# Patient Record
Sex: Male | Born: 2000 | Race: Black or African American | Hispanic: No | Marital: Single | Smoking: Never smoker
Health system: Southern US, Community
[De-identification: ages and names within clinical notes are randomized; demographics above are authoritative.]

## PROBLEM LIST (undated history)

## (undated) DIAGNOSIS — D571 Sickle-cell disease without crisis: Secondary | ICD-10-CM

## (undated) DIAGNOSIS — K5090 Crohn's disease, unspecified, without complications: Secondary | ICD-10-CM

## (undated) DIAGNOSIS — D573 Sickle-cell trait: Secondary | ICD-10-CM

---
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
## 2018-05-02 ENCOUNTER — Encounter: Payer: Self-pay | Admitting: Podiatry

## 2018-05-02 ENCOUNTER — Ambulatory Visit (INDEPENDENT_AMBULATORY_CARE_PROVIDER_SITE_OTHER): Payer: 59 | Admitting: Podiatry

## 2018-05-02 DIAGNOSIS — L600 Ingrowing nail: Secondary | ICD-10-CM | POA: Diagnosis not present

## 2018-05-02 MED ORDER — CEPHALEXIN 500 MG PO CAPS: 500.0000 mg | ORAL_CAPSULE | Freq: Three times a day (TID) | ORAL | 0 refills | Status: DC

## 2018-05-02 MED ORDER — MUPIROCIN 2 % EX OINT: 1.0000 "application " | TOPICAL_OINTMENT | Freq: Two times a day (BID) | CUTANEOUS | 2 refills | Status: DC

## 2018-05-02 NOTE — Patient Instructions (Signed)

## 2018-05-12 NOTE — Progress Notes (Signed)
Subjective: 18 year old male presents the office today for concerns of a recurrent ingrown toenail of the left big toe.  This is been ongoing for last several months the area is painful with pressure in shoes.  Denies any drainage or pus coming from the area. Denies any systemic complaints such as fevers, chills, nausea, vomiting. No acute changes since last appointment, and no other complaints at this time.   He recently just started stelara and had his first dose last week and scheduled for next week for second dose.  Objective: AAO x3, NAD DP/PT pulses palpable bilaterally, CRT less than 3 seconds Chronic ingrown toenails present in the left hallux toenail there is granulation tissue present medial corner on both medial lateral aspects.  There is no drainage or pus there is localized edema, to the area and faint erythema.  No ascending cellulitis.  No malodor. No open lesions or pre-ulcerative lesions.  No pain with calf compression, swelling, warmth, erythema  Assessment: Chronic ingrown toenails left hallux  Plan: -All treatment options discussed with the patient including all alternatives, risks, complications.  -At this time I do recommend a partial nail avulsion with chemical matricectomy given the ongoing nature of this.  However he just recently started his new medication.  Scheduled for next dose next week.  We will go ahead and do a round of antibiotics are prescribed Keflex to do before his next dose.  Recommend Epson salt soaks, antibiotic ointment and a bandage.  We will plan on doing the procedure after he finishes the next dose and that way he has a several weeks office in order to heal without any issues. -Patient encouraged to call the office with any questions, concerns, change in symptoms.

## 2018-05-19 ENCOUNTER — Ambulatory Visit: Payer: 59 | Admitting: Podiatry

## 2018-06-02 ENCOUNTER — Ambulatory Visit: Payer: 59 | Admitting: Podiatry

## 2018-06-10 IMAGING — CT CT ABD-PELV W/ CM
2 of 4 series · 15 of 46 positions shown, 17 images · IV contrast (iopamidol)
Comparison: 06/29/2014 CT abdomen and pelvis.

CLINICAL DATA: 15 y/o  M; Crohn's flare with concern for abscess.

EXAM:
CT ABDOMEN AND PELVIS WITH CONTRAST
TECHNIQUE: Multidetector CT imaging of the abdomen and pelvis was performed
using the standard protocol following bolus administration of
intravenous contrast.
CONTRAST:  100mL [id number] IOPAMIDOL (XGGEQ6-NJJ) INJECTION 61%

[Series 3: a/p w/ 5mm · axial · 0.71mm/px · z∈[+858,+1284]mm · 12 of 95 slices shown, 14 images]
[im 5/95  soft-tissue]
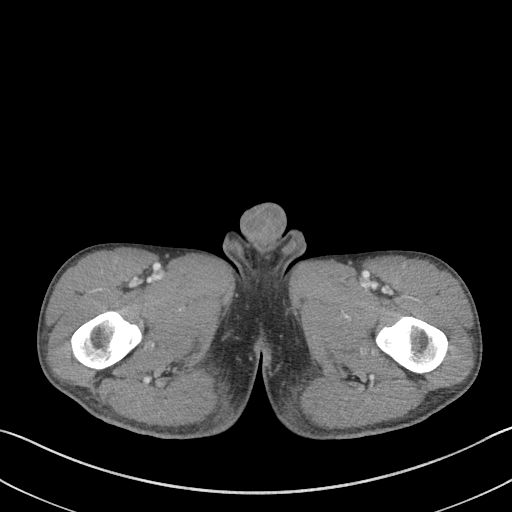
[im 5/95  bone]
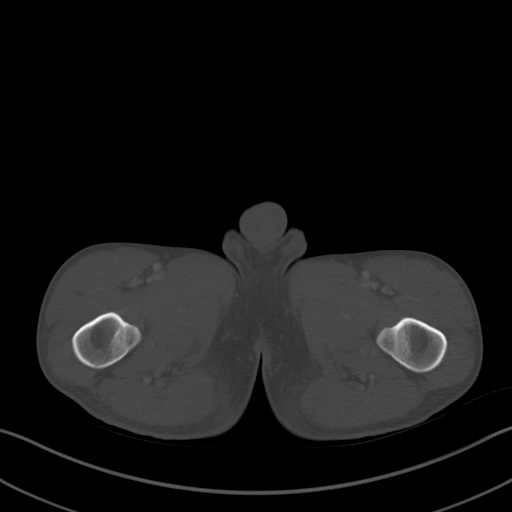
[im 15/95  soft-tissue]
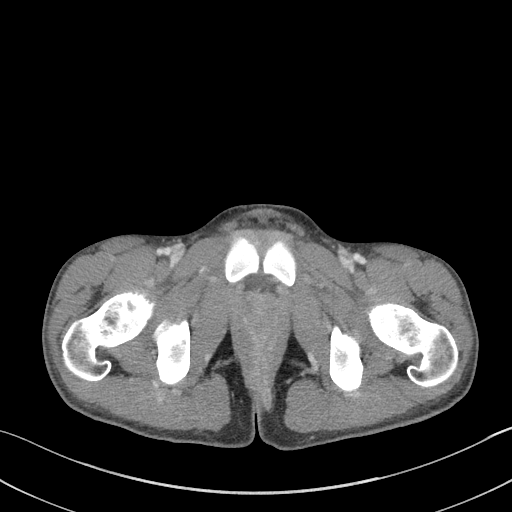
[im 19/95  soft-tissue]
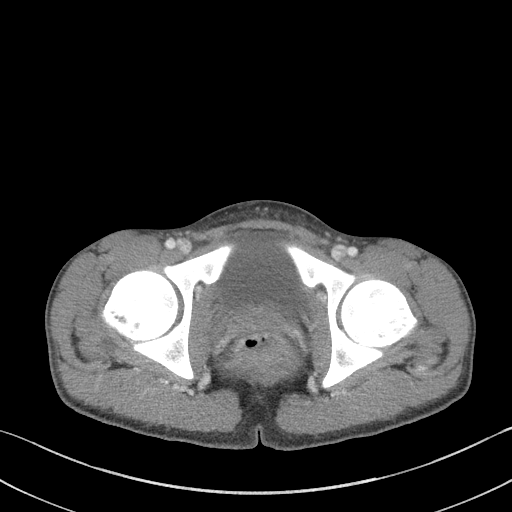
[im 29/95  soft-tissue]
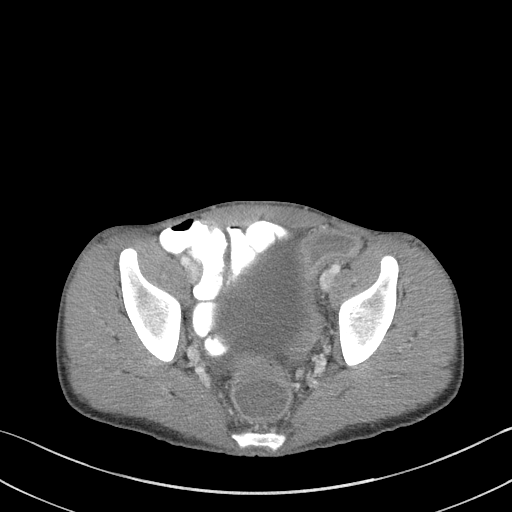
[im 38/95  soft-tissue]
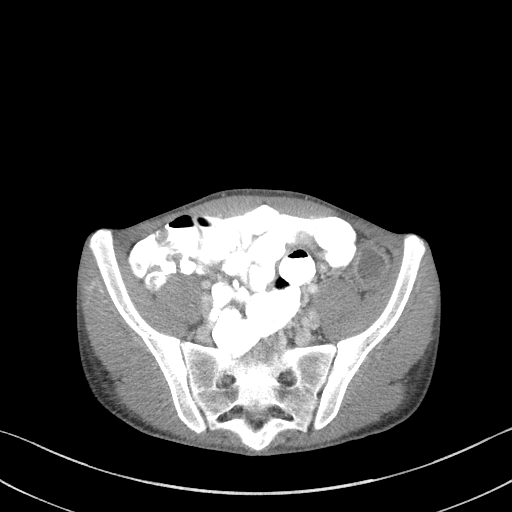
[im 43/95  soft-tissue]
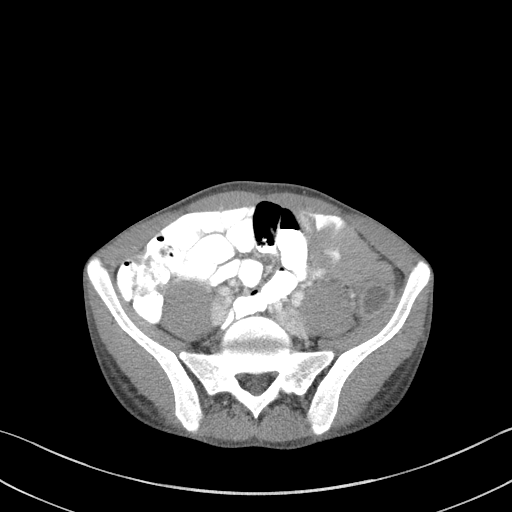
[im 52/95  soft-tissue]
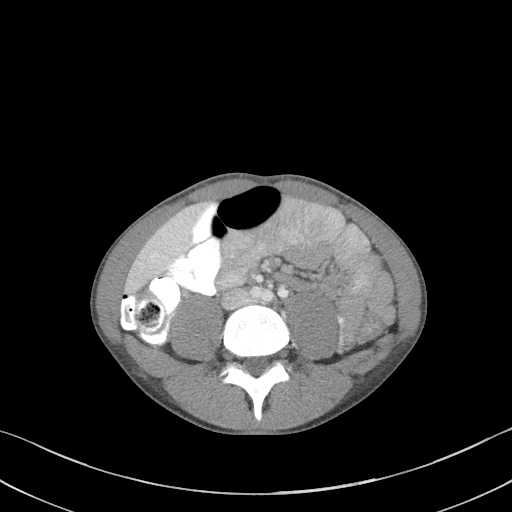
[im 57/95  soft-tissue]
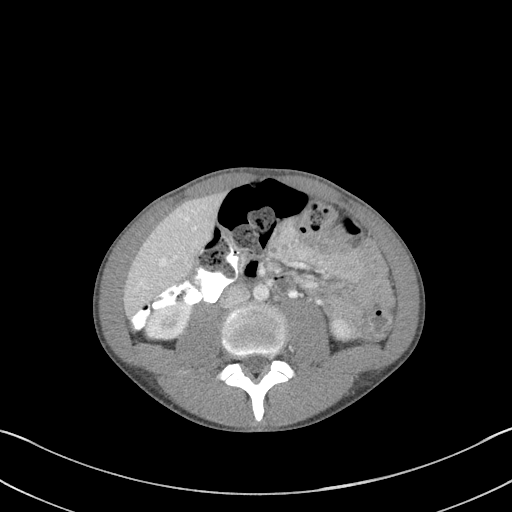
[im 66/95  soft-tissue]
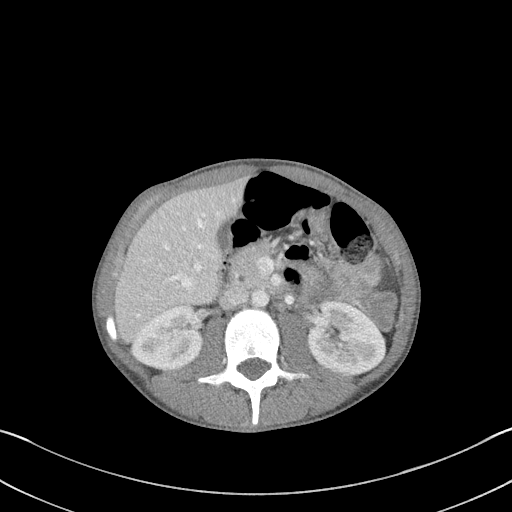
[im 66/95  bone]
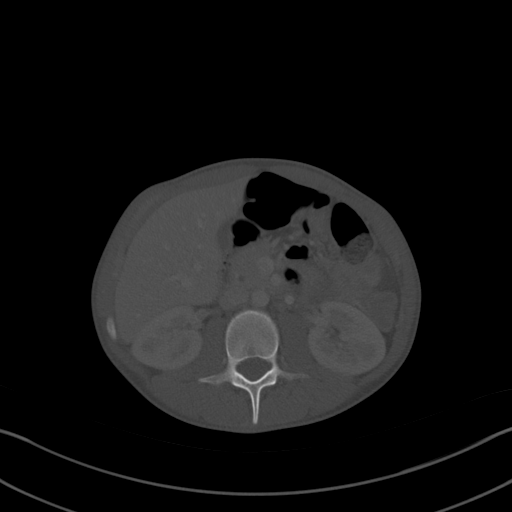
[im 76/95  soft-tissue]
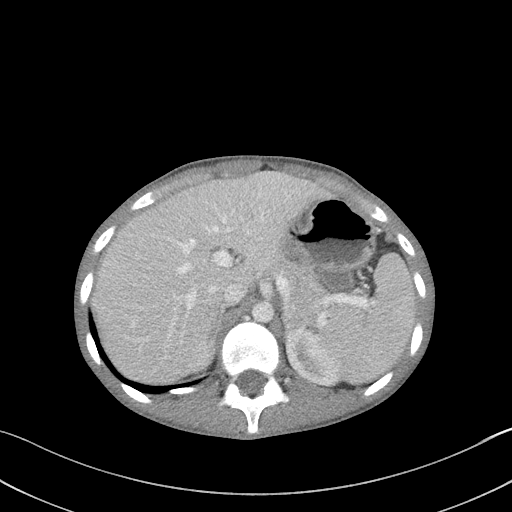
[im 80/95  soft-tissue]
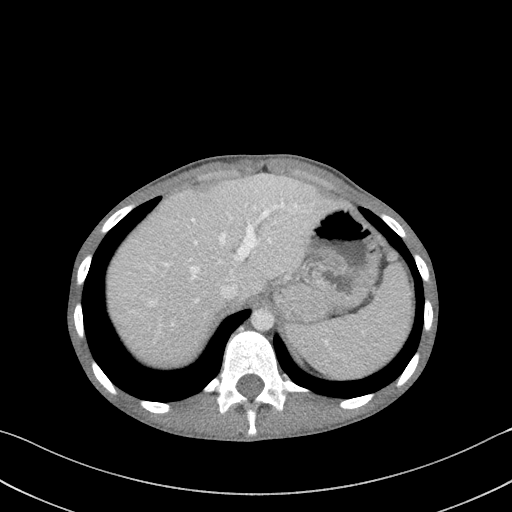
[im 90/95  soft-tissue]
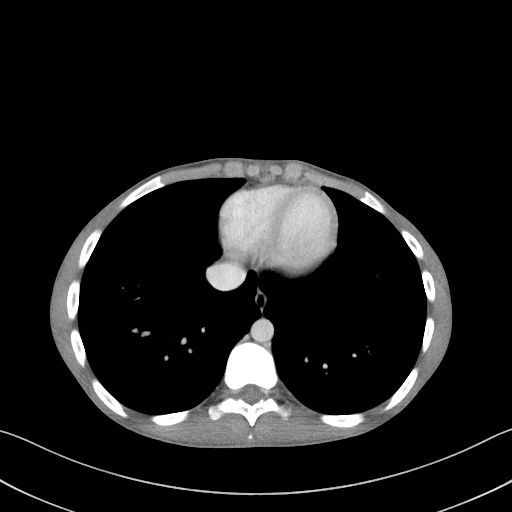

[Series 6: a/p w/ cor · coronal · 0.57mm/px · 3 of 106 slices shown]
[im 36/106  soft-tissue]
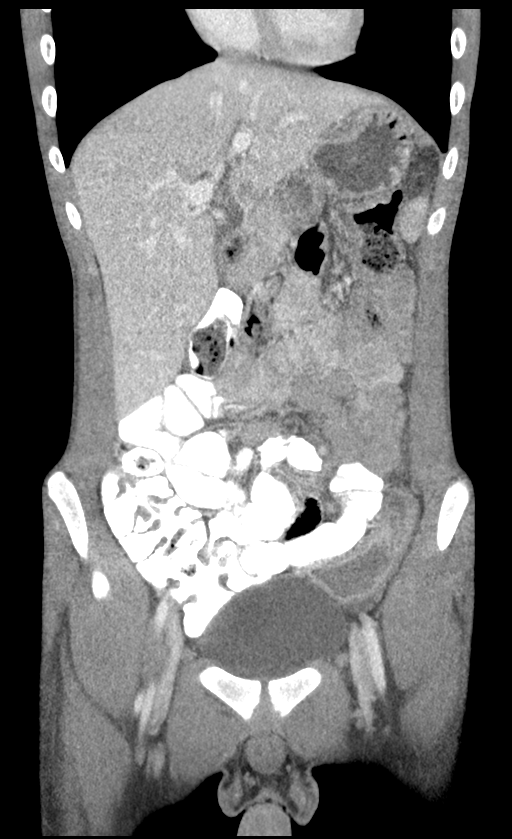
[im 47/106  soft-tissue]
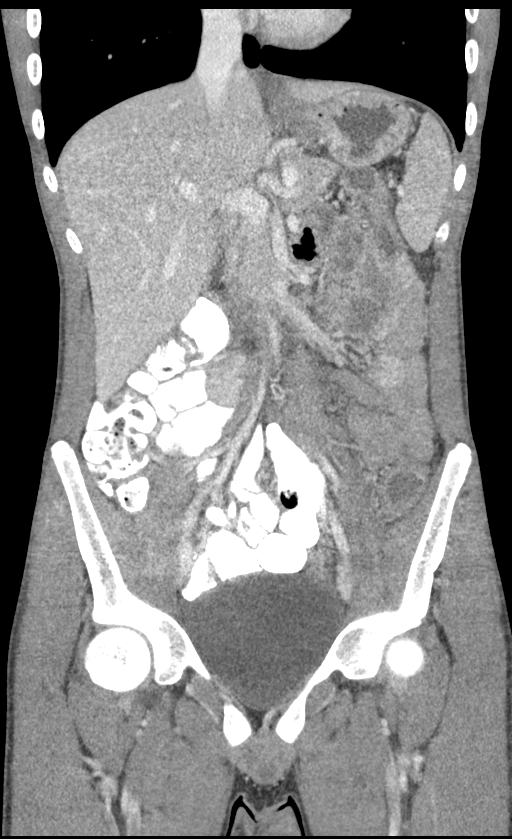
[im 59/106  soft-tissue]
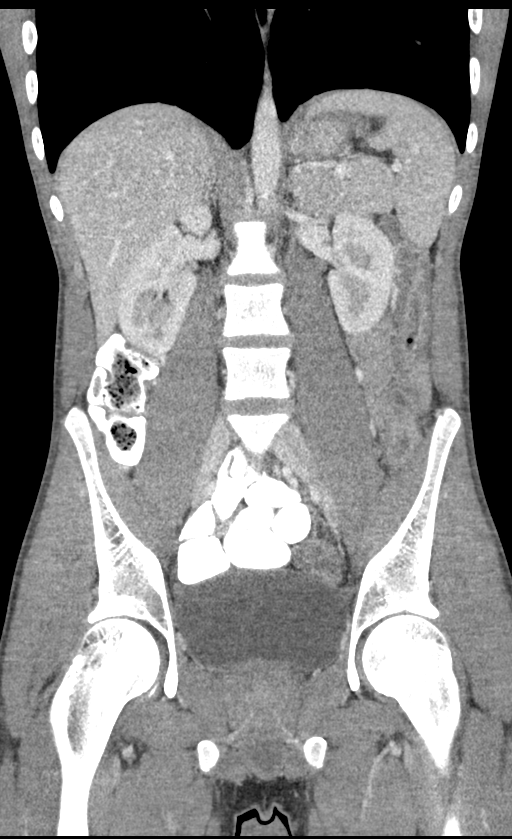

[15 of 46 positions shown; findings below may reference images not displayed]

FINDINGS: Lower chest: No acute abnormality.

Hepatobiliary: No focal liver abnormality is seen. No gallstones,
gallbladder wall thickening, or intrahepatic biliary dilatation.
Stable mild dilatation of the common bile duct.

Pancreas: Unremarkable. No pancreatic ductal dilatation or
surrounding inflammatory changes.

Spleen: Normal in size without focal abnormality.

Adrenals/Urinary Tract: Adrenal glands are unremarkable. Kidneys are
normal, without renal calculi, focal lesion, or hydronephrosis.
Bladder is unremarkable.

Stomach/Bowel: Oral contrast is seen within the distal small bowel
and proximal colon. No obstructive changes of bowel. Normal
appendix. No abscess. Mild mucosal thickening within the descending
and sigmoid colon.

Vascular/Lymphatic: No significant vascular findings are present. No
enlarged abdominal or pelvic lymph nodes.

Reproductive: Prostate is unremarkable.

Other: Developing left perianal fistula extending 2 cm into the left
gluteal fold (series 3, image 81).

Musculoskeletal: No acute or significant osseous findings.
IMPRESSION: 1. Mild mucosal thickening of descending and sigmoid colon may
represent infectious/inflammatory colitis. No obstructive changes of
bowel.
2. No intraperitoneal abscess.
3. Developing left perianal fistula extending approximately 2 cm
into the left gluteal fold.

By: Zeinab Tiger M.D.

## 2018-06-27 ENCOUNTER — Other Ambulatory Visit: Payer: Self-pay

## 2018-06-27 ENCOUNTER — Ambulatory Visit (INDEPENDENT_AMBULATORY_CARE_PROVIDER_SITE_OTHER): Payer: 59 | Admitting: Podiatry

## 2018-06-27 ENCOUNTER — Encounter: Payer: Self-pay | Admitting: Podiatry

## 2018-06-27 DIAGNOSIS — L600 Ingrowing nail: Secondary | ICD-10-CM | POA: Diagnosis not present

## 2018-06-27 MED ORDER — HYDROCODONE-ACETAMINOPHEN 5-325 MG PO TABS: 1.0000 | ORAL_TABLET | Freq: Four times a day (QID) | ORAL | 0 refills | Status: DC | PRN

## 2018-06-27 MED ORDER — CEPHALEXIN 500 MG PO CAPS: 500.0000 mg | ORAL_CAPSULE | Freq: Three times a day (TID) | ORAL | 0 refills | Status: DC

## 2018-06-27 NOTE — Patient Instructions (Signed)

## 2018-06-29 ENCOUNTER — Telehealth: Payer: Self-pay | Admitting: Podiatry

## 2018-06-29 NOTE — Telephone Encounter (Signed)
lvm for patient to call and schedule a virtual visit for 1 week nail check.

## 2018-07-05 DIAGNOSIS — L600 Ingrowing nail: Secondary | ICD-10-CM | POA: Insufficient documentation

## 2018-07-05 NOTE — Progress Notes (Signed)
Subjective: 18 year old male presents the office today with his mom for concerns of ingrown toenails to both of his big toes.  Is been a chronic issue for him today he presents today requesting a partial nail avulsion with chemical matricectomy given the chronic nature of the ingrowing nails.  Painful with pressure in shoes.  Denies any drainage or pus coming from the area. Denies any systemic complaints such as fevers, chills, nausea, vomiting. No acute changes since last appointment, and no other complaints at this time.   Objective: AAO x3, NAD DP/PT pulses palpable bilaterally, CRT less than 3 seconds Chronic ingrowing of bilateral hallux toenails along the medial and lateral aspects with granulation tissue present.  There is localized edema there is no erythema or warmth.  No purulence identified.  Tenderness palpation of the nail corners. No open lesions or pre-ulcerative lesions.  No pain with calf compression, swelling, warmth, erythema  Assessment: Chronic bilateral hallux ingrown toenails  Plan: -All treatment options discussed with the patient including all alternatives, risks, complications.  -At this time, the patient is requesting partial nail removal with chemical matricectomy to the symptomatic portion of the nail. Risks and complications were discussed with the patient for which they understand and written consent was obtained. Under sterile conditions a total of 3 mL of a mixture of 2% lidocaine plain and 0.5% Marcaine plain was infiltrated in a hallux block fashion. Once anesthetized, the skin was prepped in sterile fashion. A tourniquet was then applied. Next the medial and laterals aspect of hallux nail border was then sharply excised making sure to remove the entire offending nail border. Once the nails were ensured to be removed area was debrided and the underlying skin was intact. There is no purulence identified in the procedure. Next phenol was then applied under standard  conditions and copiously irrigated. Silvadene was applied. A dry sterile dressing was applied. After application of the dressing the tourniquet was removed and there is found to be an immediate capillary refill time to the digit. The patient tolerated the procedure well any complications. Post procedure instructions were discussed the patient for which he verbally understood. Follow-up in one week for nail check or sooner if any problems are to arise. Discussed signs/symptoms of infection and directed to call the office immediately should any occur or go directly to the emergency room. In the meantime, encouraged to call the office with any questions, concerns, changes symptoms. -Keflex -Patient encouraged to call the office with any questions, concerns, change in symptoms.   Trula Slade DPM

## 2018-07-07 ENCOUNTER — Telehealth (INDEPENDENT_AMBULATORY_CARE_PROVIDER_SITE_OTHER): Payer: 59 | Admitting: Podiatry

## 2018-07-07 DIAGNOSIS — L600 Ingrowing nail: Secondary | ICD-10-CM | POA: Diagnosis not present

## 2018-07-11 NOTE — Progress Notes (Signed)
Virtual Visit via Video Note  I connected with Luke Richmond on 07/11/18 at  4:50 PM EDT by a video enabled telemedicine application and verified that I am speaking with the correct person using two identifiers.  Location: Patient: Home Provider: Office   I discussed the limitations of evaluation and management by telemedicine and the availability of in person appointments. The patient expressed understanding and agreed to proceed.  History of Present Illness: 18 year old male patient underwent bilateral partial nail avulsions with chemical matricectomy to both medial lateral corners.  He states he is doing well.  He is continue soaking Epson salt soaks twice a day covered antibiotic ointment and a bandage.  He states that overall is feeling better.  Some occasional discomfort but overall is much improved.  Denies any drainage or pus.  No other concerns today.  Denies any fevers or chills or any other systemic symptoms.   Observations/Objective: Scabs are starting to form of the procedure sites and there is some small meta granulation tissue present.  On present on the nails there is no pain.  There is no drainage or pus like identified today.  Minimal edema.  Assessment and Plan: Follow-up partial nail avulsion bilateral hallux  Plan continue soaking Epson salt soaks twice a day covered with antibiotic ointment and a bandage in a day which may be area open at nighttime.  Monitor for any signs or symptoms of infection, no need for should any occur.  If not completely healed the next 2 weeks let me know or sooner if any issues are to arise.  Follow Up Instructions: See above   I discussed the assessment and treatment plan with the patient. The patient was provided an opportunity to ask questions and all were answered. The patient agreed with the plan and demonstrated an understanding of the instructions.   The patient was advised to call back or seek an in-person evaluation if the symptoms  worsen or if the condition fails to improve as anticipated.  I provided 7 minutes of non-face-to-face time during this encounter.   Trula Slade, DPM

## 2019-02-24 HISTORY — PX: ILEOSTOMY: SHX1783

## 2019-08-07 ENCOUNTER — Ambulatory Visit (INDEPENDENT_AMBULATORY_CARE_PROVIDER_SITE_OTHER): Payer: No Typology Code available for payment source | Admitting: Podiatry

## 2019-08-07 DIAGNOSIS — M79675 Pain in left toe(s): Secondary | ICD-10-CM | POA: Diagnosis not present

## 2019-08-07 DIAGNOSIS — L600 Ingrowing nail: Secondary | ICD-10-CM | POA: Diagnosis not present

## 2019-08-07 MED ORDER — CEPHALEXIN 500 MG PO CAPS: 500.0000 mg | ORAL_CAPSULE | Freq: Three times a day (TID) | ORAL | 0 refills | Status: DC

## 2019-08-07 NOTE — Patient Instructions (Signed)

## 2019-08-08 NOTE — Progress Notes (Signed)
Subjective: 19 year old male presents With mom for concerns of recurrent ingrown toenail left big toe, medial aspect has been ongoing for last several months has been intermittent and at maximum is pain levels 5/10 describes throbbing discomfort.  Denies any drainage or pus or any swelling. Denies any systemic complaints such as fevers, chills, nausea, vomiting. No acute changes since last appointment, and no other complaints at this time.   Objective: AAO x3, NAD DP/PT pulses palpable bilaterally, CRT less than 3 seconds Incurvation present to the medial aspect left hallux toenail in the proximal aspect with mild tenderness there is no edema, erythema or signs of infection noted today. No pain with calf compression, swelling, warmth, erythema  Assessment: Ingrown toenail left medial hallux reoccurrence  Plan: -All treatment options discussed with the patient including all alternatives, risks, complications.  -At this time, the patient is requesting partial nail removal with chemical matricectomy to the symptomatic portion of the nail. Risks and complications were discussed with the patient for which they understand and written consent was obtained. Under sterile conditions a total of 3 mL of a mixture of 2% lidocaine plain and 0.5% Marcaine plain was infiltrated in a hallux block fashion. Once anesthetized, the skin was prepped in sterile fashion. A tourniquet was then applied. Next the medial aspect of hallux nail border was then sharply excised making sure to remove the entire offending nail border. Once the nails were ensured to be removed area was debrided and the underlying skin was intact. There is no purulence identified in the procedure. Next phenol was then applied under standard conditions and copiously irrigated. Silvadene was applied. A dry sterile dressing was applied. After application of the dressing the tourniquet was removed and there is found to be an immediate capillary refill time  to the digit. The patient tolerated the procedure well any complications. Post procedure instructions were discussed the patient for which he verbally understood. Follow-up in one week for nail check or sooner if any problems are to arise. Discussed signs/symptoms of infection and directed to call the office immediately should any occur or go directly to the emergency room. In the meantime, encouraged to call the office with any questions, concerns, changes symptoms. -Keflex -Patient encouraged to call the office with any questions, concerns, change in symptoms.   Trula Slade DPM

## 2019-08-24 ENCOUNTER — Telehealth (INDEPENDENT_AMBULATORY_CARE_PROVIDER_SITE_OTHER): Payer: No Typology Code available for payment source | Admitting: Podiatry

## 2019-08-24 ENCOUNTER — Other Ambulatory Visit: Payer: Self-pay

## 2019-08-24 DIAGNOSIS — L600 Ingrowing nail: Secondary | ICD-10-CM

## 2019-09-03 NOTE — Progress Notes (Signed)
Virtual Visit via Telephone Note  I connected with Kippy Gohman on 08/24/2019 at  4:30 PM EDT by telephone and verified that I am speaking with the correct person using two identifiers.  Location: Patient: Home Provider: Office    I discussed the limitations, risks, security and privacy concerns of performing an evaluation and management service by telephone and the availability of in person appointments. I also discussed with the patient that there may be a patient responsible charge related to this service. The patient expressed understanding and agreed to proceed.   History of Present Illness:  19 year old male recently underwent left medial hallux partial nail avulsion.  He states the toe is been doing well with no pain, drainage.  He has been soaking and Epson salts, antibiotic ointment and a bandage.  Denies any redness or drainage and swelling.  He has no concerns today.  He states the toe is healing well.   Observations/Objective: Denies any redness or drainage coming from the area no pain.  Assessment and Plan: Status post partial nail avulsion with chemical matricectomy  -Continue soaking Epson salt soaks twice a day covering with antibiotic ointment and a bandage on the day.  Leave the area open at nighttime.  Continues into the area has scabbed and there is no swelling redness drainage or any issues.  Follow me as needed.  Follow Up Instructions: Follow-up as needed   I discussed the assessment and treatment plan with the patient. The patient was provided an opportunity to ask questions and all were answered. The patient agreed with the plan and demonstrated an understanding of the instructions.   The patient was advised to call back or seek an in-person evaluation if the symptoms worsen or if the condition fails to improve as anticipated.  I provided 4 minutes of non-face-to-face time during this encounter.   Trula Slade, DPM

## 2020-06-14 ENCOUNTER — Encounter (HOSPITAL_COMMUNITY): Payer: Self-pay

## 2020-06-14 ENCOUNTER — Ambulatory Visit (HOSPITAL_COMMUNITY): Admission: EM | Admit: 2020-06-14 | Discharge: 2020-06-14 | Payer: No Typology Code available for payment source

## 2020-06-14 ENCOUNTER — Other Ambulatory Visit: Payer: Self-pay

## 2020-06-14 DIAGNOSIS — R1012 Left upper quadrant pain: Secondary | ICD-10-CM | POA: Diagnosis not present

## 2020-06-14 DIAGNOSIS — Z932 Ileostomy status: Secondary | ICD-10-CM

## 2020-06-14 DIAGNOSIS — J111 Influenza due to unidentified influenza virus with other respiratory manifestations: Secondary | ICD-10-CM | POA: Diagnosis not present

## 2020-06-14 DIAGNOSIS — R1011 Right upper quadrant pain: Secondary | ICD-10-CM | POA: Diagnosis not present

## 2020-06-14 DIAGNOSIS — K50919 Crohn's disease, unspecified, with unspecified complications: Secondary | ICD-10-CM

## 2020-06-14 NOTE — ED Notes (Signed)
Patient is being discharged from the Urgent Care and sent to the Emergency Department via [facility] with Mother . Per Olene Craven, PA, patient is in need of higher level of care due to severe chest and abdominal pain. Patient is aware and verbalizes understanding of plan of care.  Vitals:   06/14/20 1506  BP: 116/68  Pulse: (!) 132  Resp: 17  Temp: 99.3 F (37.4 C)  SpO2: 100%

## 2020-06-14 NOTE — ED Provider Notes (Signed)
[facility]    CSN: [id number] Arrival date & time: 06/14/20  1411      History   Chief Complaint Chief Complaint  Patient presents with  . Generalized Body Aches  . Fever  . Emesis  . Diarrhea    HPI Luke Richmond is a 20 y.o. male presenting with flulike symptoms as well as 10/10 abdominal pain and 8/10 chest pain.  Describes chest pain as left-sided and sharp, associated with fast heart rate.  Generalized weakness and body aches.  Endorses nausea with vomiting and increased output into his ileostomy bag.  Tylenol and tramadol providing no relief. Denies shortness of breath, cough, congestion, facial pain, teeth pain, headaches, sore throat, loss of taste/smell, swollen lymph nodes, ear pain.    HPI  Past Medical History:  Diagnosis Date  . Crohn disease ([place])    Symptoms 10/2013, diagnosed 02/2014, last remicade 574m 05/31/2015  . Sickle cell trait ([facility]     Patient Active Problem List   Diagnosis Date Noted  . Ingrown toenail 07/05/2018  . 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 Rectal pain 04/02/2017  . Perirectal abscess 02/02/2017  . Sickle cell trait ([facility] 10/01/2016  . Iron deficiency anemia   . Moderate malnutrition ([facility]   . Crohn's disease with complication ([facility]   . Diarrhea 01/12/2014  . Hematochezia 01/12/2014    History reviewed. No pertinent surgical history.     Home Medications    Prior to Admission medications   Medication Sig Start Date End Date Taking? Authorizing Provider  loperamide (IMODIUM) 2 MG capsule Take by mouth. 04/09/20  Yes [provider]  Risankizumab-rzaa 150 MG/ML SOAJ Inject the contents of 4 pens (600 mg) into the skin at weeks 0, 4, and 8. Then, inject the contents of 2 pens (300 mg) into the skin every 8 weeks thereafter. 04/16/20  Yes [provider]  traMADol (ULTRAM) 50 MG tablet Take 1 tablet by mouth every 6 (six) hours as needed. 04/25/20  Yes [provider]  cephALEXin (KEFLEX) 500 MG capsule Take 1 capsule (500 mg total) by mouth 3 (three) times daily. 08/07/19   WTrula Slade DPM  docusate sodium (COLACE) 100 MG capsule Take by mouth.    [provider]  ferrous sulfate 325 (65 FE) MG tablet Take 325 mg by mouth daily with breakfast.    [provider]  folic acid (FOLVITE) 1 MG tablet TAKE 1 TABLET BY MOUTH EVERY DAY 02/13/17   [provider]  lidocaine (XYLOCAINE) 5 % ointment Apply topically. 04/02/17   [provider]  mesalamine (APRISO) 0.375 g 24 hr capsule Take 1,500 mg by mouth daily.    [provider]  methotrexate (RHEUMATREX) 2.5 MG tablet Take by mouth.    [provider]  Multiple Vitamin (MULTIVITAMIN) capsule Take by mouth.    [provider]  Multiple Vitamins-Minerals (MULTIVITAMIN ADULT PO) Take by mouth.    [provider]  mupirocin ointment (BACTROBAN) 2 % Apply 1 application topically 2 (two) times daily. 05/02/18   WTrula Slade DPM  ondansetron (ZOFRAN-ODT) 4 MG disintegrating tablet DISSOLVE 1 TABLET ON THE TONGUE EVERY 6 HOURS AS NEEDED FOR NAUSEA 12/25/17   [provider]  traMADol (ULTRAM) 50 MG tablet TAKE 1 TABLET BY MOUTH 3 TIMES A DAY AS NEEDED FOR PAIN 04/13/17   [provider]  ustekinumab (STELARA) 90 MG/ML SOSY injection Inject into the skin. 04/25/18   [provider]    Family History Family History  Problem Relation Age of Onset  . Glaucoma Mother   . Hypertension Father   . Diabetes Father        type II  . Diverticulitis Maternal Aunt   . Cancer Maternal Grandmother        colon cancer, maternal great grandmother  . Cancer Other   . Irritable bowel syndrome Other   . Diverticulitis Other     Social History Social History   Tobacco Use  . Smoking status: Never Smoker  . Smokeless tobacco: Never Used  Substance Use Topics  . Alcohol use: No  . Drug use: No     Allergies    Adalimumab, Infliximab, Peanut oil, and Peanut-containing drug products   Review of Systems Review of Systems  Constitutional: Negative for appetite change, chills and fever.  HENT: Negative for congestion, ear pain, rhinorrhea, sinus pressure, sinus pain and sore throat.   Eyes: Negative for redness and visual disturbance.  Respiratory: Negative for cough, chest tightness, shortness of breath and wheezing.   Cardiovascular: Positive for chest pain and palpitations.  Gastrointestinal: Positive for abdominal pain, diarrhea, nausea and vomiting. Negative for constipation.  Genitourinary: Negative for dysuria, frequency and urgency.  Musculoskeletal: Negative for myalgias.  Neurological: Negative for dizziness, weakness and headaches.  Psychiatric/Behavioral: Negative for confusion.  All other systems reviewed and are negative.    Physical Exam Triage Vital Signs ED Triage Vitals  Enc Vitals Group     BP 06/14/20 1506 116/68     Pulse Rate 06/14/20 1506 (!) 132     Resp 06/14/20 1506 17     Temp 06/14/20 1506 99.3 F (37.4 C)     Temp src --      SpO2 06/14/20 1506 100 %     Weight --      Height --      Head Circumference --      Peak Flow --      Pain Score 06/14/20 1504 10     Pain Loc --      Pain Edu? --      Excl. in [place]? --    No data found.  Updated Vital Signs BP 116/68   Pulse (!) 132   Temp 99.3 F (37.4 C)   Resp 17   SpO2 100%   Visual Acuity Right Eye Distance:   Left Eye Distance:   Bilateral Distance:    Right Eye Near:   Left Eye Near:    Bilateral Near:     Physical Exam Vitals reviewed.  Constitutional:      General: He is not in acute distress.    Appearance: Normal appearance. He is ill-appearing. He is not diaphoretic.     Comments: Patient is being transported in wheelchair due to bodyaches  HENT:     Head: Normocephalic and atraumatic.     Mouth/Throat:     Mouth: Mucous membranes are moist.     Comments: Moist mucous  membranes Eyes:     Extraocular Movements: Extraocular movements intact.     Pupils: Pupils are equal, round, and reactive to light.  Cardiovascular:     Rate and Rhythm: Regular rhythm. Tachycardia present.     Pulses:          Radial pulses are 2+ on the right side and 2+ on the left side.     Heart sounds: Normal heart sounds.  Pulmonary:     Effort: Pulmonary effort  is normal.     Breath sounds: Normal breath sounds. No wheezing, rhonchi or rales.  Abdominal:     General: Bowel sounds are normal. There is no distension.     Palpations: Abdomen is soft. There is no mass.     Tenderness: There is no abdominal tenderness. There is no right CVA tenderness, left CVA tenderness, guarding or rebound.     Comments: Abdomen is significantly tender to palpation, worst in LUQ and RUQ surrounding ileostomy bag. Patient with pain out of proportion to exam  Musculoskeletal:     Right lower leg: No edema.     Left lower leg: No edema.  Skin:    General: Skin is warm.     Capillary Refill: Capillary refill takes less than 2 seconds.     Comments: Good skin turgor  Neurological:     General: No focal deficit present.     Mental Status: He is alert and oriented to person, place, and time.  Psychiatric:        Mood and Affect: Mood normal.        Behavior: Behavior normal.        Thought Content: Thought content normal.        Judgment: Judgment normal.      UC Treatments / Results  Labs (all labs ordered are listed, but only abnormal results are displayed) Labs Reviewed - No data to display  EKG   Radiology No results found.  Procedures Procedures (including critical care time)  Medications Ordered in UC Medications - No data to display  Initial Impression / Assessment and Plan / UC Course  I have reviewed the triage vital signs and the nursing notes.  Pertinent labs & imaging results that were available during my care of the patient were reviewed by me and considered in my  medical decision making (see chart for details).     This patient is a 20 year old male presenting with severe abdominal and chest pain following 4 days of flulike symptoms.  He is afebrile but tachycardic, nontachypneic, oxygenating well on room air.  On exam, he does have significant abdominal pain out of proportion to exam, worst surrounding ostomy bag.  He is also ambulating using wheelchair due to severity of his body aches. EKG today with sinus tachycardia .  Given severe abdominal pain and his complicated GI history including complicated Crohn's with fistula, I am referring this patient to the emergency room for further abdominal work-up including imaging.  He is hemodynamically stable for transfer in personal vehicle driven by mother. The patient left after EKG without being discharged. He did state he was headed to the ED.   Final Clinical Impressions(s) / UC Diagnoses   Final diagnoses:  Influenza-like illness  RUQ pain  LUQ pain  Ileostomy in place [facility])  Crohn's disease with complication, unspecified gastrointestinal tract location [facility])     Discharge Instructions     -Head straight to [facility] ED for further evaluation and management of severe abdominal pain in the setting of chron's disease. You likely require abdominal imaging and possibly additional cardiac workup, which we are unable to perform in the urgent care setting.  -If you develop worsening of symptoms while on the way, like abdominal pain, chest pain- stop and call 911.   ED Prescriptions    None     PDMP not reviewed this encounter.   Hazel Sams, PA-C 06/14/20 1702

## 2020-06-14 NOTE — ED Triage Notes (Signed)
Pt in with c/o body aches, diarrhea, vomiting and fever tmax 101 that has been going on since last Tuesday  Pt has been taking tylenol and tramadol with no relief

## 2020-06-14 NOTE — Discharge Instructions (Addendum)
-  Head straight to [facility] ED for further evaluation and management of severe abdominal pain in the setting of chron's disease. You likely require abdominal imaging and possibly additional cardiac workup, which we are unable to perform in the urgent care setting.  -If you develop worsening of symptoms while on the way, like abdominal pain, chest pain- stop and call 911.

## 2020-06-15 ENCOUNTER — Other Ambulatory Visit: Payer: Self-pay

## 2020-06-15 ENCOUNTER — Emergency Department (HOSPITAL_COMMUNITY): Payer: No Typology Code available for payment source

## 2020-06-15 ENCOUNTER — Encounter (HOSPITAL_COMMUNITY): Payer: Self-pay | Admitting: *Deleted

## 2020-06-15 ENCOUNTER — Inpatient Hospital Stay (HOSPITAL_COMMUNITY)
Admission: EM | Admit: 2020-06-15 | Discharge: 2020-06-20 | DRG: 385 | Disposition: A | Discharge status: Home / Self Care | Payer: No Typology Code available for payment source | Attending: Internal Medicine | Admitting: Internal Medicine

## 2020-06-15 DIAGNOSIS — D573 Sickle-cell trait: Secondary | ICD-10-CM | POA: Diagnosis not present

## 2020-06-15 DIAGNOSIS — Z8249 Family history of ischemic heart disease and other diseases of the circulatory system: Secondary | ICD-10-CM | POA: Diagnosis not present

## 2020-06-15 DIAGNOSIS — Z888 Allergy status to other drugs, medicaments and biological substances status: Secondary | ICD-10-CM

## 2020-06-15 DIAGNOSIS — Z833 Family history of diabetes mellitus: Secondary | ICD-10-CM

## 2020-06-15 DIAGNOSIS — Z91010 Allergy to peanuts: Secondary | ICD-10-CM | POA: Diagnosis not present

## 2020-06-15 DIAGNOSIS — K626 Ulcer of anus and rectum: Secondary | ICD-10-CM | POA: Diagnosis present

## 2020-06-15 DIAGNOSIS — K50113 Crohn's disease of large intestine with fistula: Secondary | ICD-10-CM | POA: Diagnosis present

## 2020-06-15 DIAGNOSIS — R1084 Generalized abdominal pain: Secondary | ICD-10-CM

## 2020-06-15 DIAGNOSIS — R112 Nausea with vomiting, unspecified: Secondary | ICD-10-CM | POA: Diagnosis not present

## 2020-06-15 DIAGNOSIS — Z681 Body mass index (BMI) 19 or less, adult: Secondary | ICD-10-CM | POA: Diagnosis not present

## 2020-06-15 DIAGNOSIS — R509 Fever, unspecified: Secondary | ICD-10-CM

## 2020-06-15 DIAGNOSIS — Z800 Family history of malignant neoplasm of digestive organs: Secondary | ICD-10-CM | POA: Diagnosis not present

## 2020-06-15 DIAGNOSIS — R7401 Elevation of levels of liver transaminase levels: Secondary | ICD-10-CM

## 2020-06-15 DIAGNOSIS — Z932 Ileostomy status: Secondary | ICD-10-CM | POA: Diagnosis not present

## 2020-06-15 DIAGNOSIS — R7989 Other specified abnormal findings of blood chemistry: Secondary | ICD-10-CM | POA: Diagnosis present

## 2020-06-15 DIAGNOSIS — Z20822 Contact with and (suspected) exposure to covid-19: Secondary | ICD-10-CM | POA: Diagnosis present

## 2020-06-15 DIAGNOSIS — D571 Sickle-cell disease without crisis: Secondary | ICD-10-CM | POA: Diagnosis present

## 2020-06-15 DIAGNOSIS — Z79899 Other long term (current) drug therapy: Secondary | ICD-10-CM

## 2020-06-15 DIAGNOSIS — Z83511 Family history of glaucoma: Secondary | ICD-10-CM

## 2020-06-15 DIAGNOSIS — E43 Unspecified severe protein-calorie malnutrition: Secondary | ICD-10-CM | POA: Diagnosis not present

## 2020-06-15 HISTORY — DX: Sickle-cell disease without crisis: D57.1

## 2020-06-15 LAB — HEPATITIS PANEL, ACUTE
HCV Ab: NONREACTIVE
Hep A IgM: NONREACTIVE
Hep B C IgM: NONREACTIVE
Hepatitis B Surface Ag: NONREACTIVE

## 2020-06-15 LAB — CBC WITH DIFFERENTIAL/PLATELET
Abs Immature Granulocytes: 0.03 10*3/uL (ref 0.00–0.07)
Basophils Absolute: 0 10*3/uL (ref 0.0–0.1)
Basophils Relative: 1 %
Eosinophils Absolute: 0 10*3/uL (ref 0.0–0.5)
Eosinophils Relative: 0 %
HCT: 48.7 % (ref 39.0–52.0)
Hemoglobin: 15.5 g/dL (ref 13.0–17.0)
Immature Granulocytes: 1 %
Lymphocytes Relative: 18 %
Lymphs Abs: 0.4 10*3/uL — ABNORMAL LOW (ref 0.7–4.0)
MCH: 19.7 pg — ABNORMAL LOW (ref 26.0–34.0)
MCHC: 31.8 g/dL (ref 30.0–36.0)
MCV: 62 fL — ABNORMAL LOW (ref 80.0–100.0)
Monocytes Absolute: 0.2 10*3/uL (ref 0.1–1.0)
Monocytes Relative: 6 %
Neutro Abs: 1.7 10*3/uL (ref 1.7–7.7)
Neutrophils Relative %: 74 %
Platelets: 220 10*3/uL (ref 150–400)
RBC: 7.86 MIL/uL — ABNORMAL HIGH (ref 4.22–5.81)
RDW: 19.7 % — ABNORMAL HIGH (ref 11.5–15.5)
WBC: 2.3 10*3/uL — ABNORMAL LOW (ref 4.0–10.5)
nRBC: 0 % (ref 0.0–0.2)

## 2020-06-15 LAB — COMPREHENSIVE METABOLIC PANEL
ALT: 169 U/L — ABNORMAL HIGH (ref 0–44)
AST: 241 U/L — ABNORMAL HIGH (ref 15–41)
Albumin: 3.5 g/dL (ref 3.5–5.0)
Alkaline Phosphatase: 193 U/L — ABNORMAL HIGH (ref 38–126)
Anion gap: 11 (ref 5–15)
BUN: 12 mg/dL (ref 6–20)
CO2: 24 mmol/L (ref 22–32)
Calcium: 8.8 mg/dL — ABNORMAL LOW (ref 8.9–10.3)
Chloride: 91 mmol/L — ABNORMAL LOW (ref 98–111)
Creatinine, Ser: 0.87 mg/dL (ref 0.61–1.24)
GFR, Estimated: >60 mL/min (ref >60)
Glucose, Bld: 116 mg/dL — ABNORMAL HIGH (ref 70–99)
Potassium: 4 mmol/L (ref 3.5–5.1)
Sodium: 126 mmol/L — ABNORMAL LOW (ref 135–145)
Total Bilirubin: 6.5 mg/dL — ABNORMAL HIGH (ref 0.3–1.2)
Total Protein: 8.3 g/dL — ABNORMAL HIGH (ref 6.5–8.1)

## 2020-06-15 LAB — URINALYSIS, ROUTINE W REFLEX MICROSCOPIC
Bacteria, UA: NONE SEEN
Bilirubin Urine: NEGATIVE
Glucose, UA: NEGATIVE mg/dL
Ketones, ur: NEGATIVE mg/dL
Leukocytes,Ua: NEGATIVE
Nitrite: NEGATIVE
Protein, ur: NEGATIVE mg/dL
Specific Gravity, Urine: >1.046 — ABNORMAL HIGH (ref 1.005–1.030)
pH: 5 (ref 5.0–8.0)

## 2020-06-15 LAB — RESP PANEL BY RT-PCR (FLU A&B, COVID) ARPGX2
Influenza A by PCR: NEGATIVE
Influenza B by PCR: NEGATIVE
SARS Coronavirus 2 by RT PCR: NEGATIVE

## 2020-06-15 LAB — HIV ANTIBODY (ROUTINE TESTING W REFLEX): HIV Screen 4th Generation wRfx: NONREACTIVE

## 2020-06-15 LAB — LIPASE, BLOOD: Lipase: 21 U/L (ref 11–51)

## 2020-06-15 LAB — ACETAMINOPHEN LEVEL: Acetaminophen (Tylenol), Serum: 12 ug/mL (ref 10–30)

## 2020-06-15 MED ORDER — METRONIDAZOLE IN NACL 5-0.79 MG/ML-% IV SOLN
500.0000 mg | Freq: Three times a day (TID) | INTRAVENOUS | Status: DC
  Administered 2020-06-15 – 2020-06-20 (×14): 500 mg via INTRAVENOUS
  Filled 2020-06-15 (×17): qty 100

## 2020-06-15 MED ORDER — HYDROMORPHONE HCL 1 MG/ML IJ SOLN
0.5000 mg | Freq: Four times a day (QID) | INTRAMUSCULAR | Status: DC | PRN
  Administered 2020-06-15 – 2020-06-18 (×4): 0.5 mg via INTRAVENOUS
  Filled 2020-06-15 (×4): qty 0.5

## 2020-06-15 MED ORDER — DOCUSATE SODIUM 100 MG PO CAPS: 100.0000 mg | ORAL_CAPSULE | Freq: Every day | ORAL | Status: DC | PRN

## 2020-06-15 MED ORDER — DICYCLOMINE HCL 10 MG PO CAPS
10.0000 mg | ORAL_CAPSULE | Freq: Three times a day (TID) | ORAL | Status: DC | PRN
  Administered 2020-06-16: 10 mg via ORAL
  Filled 2020-06-15 (×2): qty 1

## 2020-06-15 MED ORDER — TRAMADOL HCL 50 MG PO TABS
50.0000 mg | ORAL_TABLET | Freq: Two times a day (BID) | ORAL | Status: DC | PRN
  Administered 2020-06-17: 50 mg via ORAL
  Filled 2020-06-15: qty 1

## 2020-06-15 MED ORDER — ENSURE ENLIVE PO LIQD
237.0000 mL | Freq: Two times a day (BID) | ORAL | Status: DC
  Administered 2020-06-16: 237 mL via ORAL

## 2020-06-15 MED ORDER — FERROUS SULFATE 325 (65 FE) MG PO TABS
325.0000 mg | ORAL_TABLET | Freq: Every day | ORAL | Status: DC
  Administered 2020-06-16 – 2020-06-19 (×3): 325 mg via ORAL
  Filled 2020-06-15 (×3): qty 1

## 2020-06-15 MED ORDER — ACETAMINOPHEN 500 MG PO TABS
1000.0000 mg | ORAL_TABLET | Freq: Four times a day (QID) | ORAL | Status: DC | PRN
  Administered 2020-06-15 – 2020-06-16 (×2): 1000 mg via ORAL
  Filled 2020-06-15 (×2): qty 2

## 2020-06-15 MED ORDER — SODIUM CHLORIDE 0.9 % IV BOLUS
1000.0000 mL | Freq: Once | INTRAVENOUS | Status: AC
  Administered 2020-06-15: 1000 mL via INTRAVENOUS

## 2020-06-15 MED ORDER — SODIUM CHLORIDE 0.9 % IV SOLN
2.0000 g | INTRAVENOUS | Status: DC
  Administered 2020-06-15 – 2020-06-19 (×5): 2 g via INTRAVENOUS
  Filled 2020-06-15: qty 20
  Filled 2020-06-15 (×2): qty 2
  Filled 2020-06-15: qty 20
  Filled 2020-06-15: qty 2

## 2020-06-15 MED ORDER — ONDANSETRON 4 MG PO TBDP: 4.0000 mg | ORAL_TABLET | Freq: Three times a day (TID) | ORAL | Status: DC | PRN

## 2020-06-15 MED ORDER — MORPHINE SULFATE (PF) 4 MG/ML IV SOLN
4.0000 mg | Freq: Once | INTRAVENOUS | Status: AC
  Administered 2020-06-15: 4 mg via INTRAVENOUS
  Filled 2020-06-15: qty 1

## 2020-06-15 MED ORDER — IOHEXOL 300 MG/ML  SOLN
100.0000 mL | Freq: Once | INTRAMUSCULAR | Status: AC | PRN
  Administered 2020-06-15: 100 mL via INTRAVENOUS

## 2020-06-15 MED ORDER — ONDANSETRON HCL 4 MG PO TABS
4.0000 mg | ORAL_TABLET | Freq: Four times a day (QID) | ORAL | Status: DC | PRN
  Administered 2020-06-16 (×2): 4 mg via ORAL
  Filled 2020-06-15 (×2): qty 1

## 2020-06-15 MED ORDER — CIPROFLOXACIN IN D5W 400 MG/200ML IV SOLN: 400.0000 mg | Freq: Two times a day (BID) | INTRAVENOUS | Status: DC

## 2020-06-15 MED ORDER — ONDANSETRON HCL 4 MG/2ML IJ SOLN
4.0000 mg | Freq: Once | INTRAMUSCULAR | Status: AC
  Administered 2020-06-15: 4 mg via INTRAVENOUS
  Filled 2020-06-15: qty 2

## 2020-06-15 MED ORDER — POTASSIUM CHLORIDE IN NACL 20-0.9 MEQ/L-% IV SOLN
INTRAVENOUS | Status: DC
  Filled 2020-06-15 (×11): qty 1000

## 2020-06-15 MED ORDER — ONDANSETRON HCL 4 MG/2ML IJ SOLN
4.0000 mg | Freq: Four times a day (QID) | INTRAMUSCULAR | Status: DC | PRN
  Filled 2020-06-15: qty 2

## 2020-06-15 NOTE — ED Notes (Signed)
Pt said he has not used the bathroom and does not think he'll be able to go anytime soon.

## 2020-06-15 NOTE — H&P (Signed)
History and Physical    Luke Richmond OEU:[id number] DOB: [birth year] DOA: 06/15/2020  PCP: Karleen Dolphin, MD  Patient coming from: Home  Chief Complaint: Fever nausea vomiting diarrhea  HPI: Luke Richmond is a 20 y.o. male with medical history significant of Crohn's disease gets his care at [facility] started on risankizumab approximately 6 weeks ago status post ileostomy history of fistula reports that his imaging since his surgery in the last year have been somewhat normal at [facility] for 5 days has been having fever nausea vomiting diarrhea.  Denies any sick contacts.  He is having some abdominal pain in the left lower quadrant.  He reports he is not frequently on steroids and has only been to the emergency department once in the last year and only hospitalized once in the last year for his surgery.  He has been losing weight however and has been diagnosed with Crohn's since the age of 14.  His GI physician at [facility] was called they recommended transferring there but there is no beds.  GI here was called who is agreeable to see patient in consultation.  Patient be referred for admission for uncertain GI illness.   Review of Systems: As per HPI otherwise 10 point review of systems negative.   Past Medical History:  Diagnosis Date  . Crohn disease ([place])    Symptoms 10/2013, diagnosed 02/2014, last remicade 543m 05/31/2015  . Sickle cell anemia (HCC)   . Sickle cell trait ([place]     History reviewed. No pertinent surgical history.   reports that he has never smoked. He has never used smokeless tobacco. He reports that he does not drink alcohol and does not use drugs.  Allergies  Allergen Reactions  . Adalimumab     Developed antibodies to med  . Infliximab Other (See Comments)    "Developed Antibodies" Developed antibody to med  . Peanut Oil     Throat itches  . Peanut-Containing Drug Products     Family History  Problem Relation Age of Onset  . Glaucoma Mother   .  Hypertension Father   . Diabetes Father        type II  . Diverticulitis Maternal Aunt   . Cancer Maternal Grandmother        colon cancer, maternal great grandmother  . Cancer Other   . Irritable bowel syndrome Other   . Diverticulitis Other     Prior to Admission medications   Medication Sig Start Date End Date Taking? Authorizing Provider  acetaminophen (TYLENOL) 500 MG tablet Take 1,000 mg by mouth every 6 (six) hours as needed for moderate pain.   Yes [provider]  dicyclomine (BENTYL) 10 MG capsule Take 10 mg by mouth 3 (three) times daily as needed for spasms. 09/21/19  Yes [provider]  docusate sodium (COLACE) 100 MG capsule Take 100 mg by mouth daily as needed for mild constipation.   Yes [provider]  ferrous sulfate 325 (65 FE) MG tablet Take 325 mg by mouth daily with breakfast.   Yes [provider]  Multiple Vitamins-Minerals (MULTIVITAMIN ADULT PO) Take 1 tablet by mouth daily.   Yes [provider]  ondansetron (ZOFRAN-ODT) 4 MG disintegrating tablet Take 4 mg by mouth every 8 (eight) hours as needed for nausea. 12/25/17  Yes [provider]  Risankizumab-rzaa 150 MG/ML SOAJ Inject 150 mg into the vein every 28 (twenty-eight) days. 04/16/20  Yes [provider]  traMADol (ULTRAM) 50 MG  tablet Take 50 mg by mouth every 12 (twelve) hours as needed for moderate pain. 04/13/17  Yes [provider]  cephALEXin (KEFLEX) 500 MG capsule Take 1 capsule (500 mg total) by mouth 3 (three) times daily. Patient not taking: No sig reported 08/07/19   Trula Slade, DPM  mupirocin ointment (BACTROBAN) 2 % Apply 1 application topically 2 (two) times daily. Patient not taking: No sig reported 05/02/18   Trula Slade, DPM    Physical Exam: Vitals:   06/15/20 1400 06/15/20 1430 06/15/20 1500 06/15/20 1530  BP: 133/90 121/78 117/79 122/79  Pulse: (!) 107 100 96 95  Resp:  18  18  Temp:      TempSrc:       SpO2: 99% 99% 99% 99%      Constitutional: NAD, calm, comfortable Vitals:   06/15/20 1400 06/15/20 1430 06/15/20 1500 06/15/20 1530  BP: 133/90 121/78 117/79 122/79  Pulse: (!) 107 100 96 95  Resp:  18  18  Temp:      TempSrc:      SpO2: 99% 99% 99% 99%   Eyes: PERRL, lids and conjunctivae normal ENMT: Mucous membranes are moist. Posterior pharynx clear of any exudate or lesions.Normal dentition.  Neck: normal, supple, no masses, no thyromegaly Respiratory: clear to auscultation bilaterally, no wheezing, no crackles. Normal respiratory effort. No accessory muscle use.  Cardiovascular: Regular rate and rhythm, no murmurs / rubs / gallops. No extremity edema. 2+ pedal pulses. No carotid bruits.  Abdomen: no tenderness, no masses palpated. No hepatosplenomegaly. Bowel sounds positive.  Musculoskeletal: no clubbing / cyanosis. No joint deformity upper and lower extremities. Good ROM, no contractures. Normal muscle tone.  Skin: no rashes, lesions, ulcers. No induration Neurologic: CN 2-12 grossly intact. Sensation intact, DTR normal. Strength 5/5 in all 4.  Psychiatric: Normal judgment and insight. Alert and oriented x 3. Normal mood.    Labs on Admission: I have personally reviewed following labs and imaging studies  CBC: Recent Labs  Lab 06/15/20 1247  WBC 2.3*  NEUTROABS 1.7  HGB 15.5  HCT 48.7  MCV 62.0*  PLT 817   Basic Metabolic Panel: Recent Labs  Lab 06/15/20 1247  NA 126*  K 4.0  CL 91*  CO2 24  GLUCOSE 116*  BUN 12  CREATININE 0.87  CALCIUM 8.8*   GFR: CrCl cannot be calculated (Unknown ideal weight.). Liver Function Tests: Recent Labs  Lab 06/15/20 1247  AST 241*  ALT 169*  ALKPHOS 193*  BILITOT 6.5*  PROT 8.3*  ALBUMIN 3.5   Recent Labs  Lab 06/15/20 1247  LIPASE 21   No results for input(s): AMMONIA in the last 168 hours. Coagulation Profile: No results for input(s): INR, PROTIME in the last 168 hours. Cardiac Enzymes: No results  for input(s): CKTOTAL, CKMB, CKMBINDEX, TROPONINI in the last 168 hours. BNP (last 3 results) No results for input(s): PROBNP in the last 8760 hours. HbA1C: No results for input(s): HGBA1C in the last 72 hours. CBG: No results for input(s): GLUCAP in the last 168 hours. Lipid Profile: No results for input(s): CHOL, HDL, LDLCALC, TRIG, CHOLHDL, LDLDIRECT in the last 72 hours. Thyroid Function Tests: No results for input(s): TSH, T4TOTAL, FREET4, T3FREE, THYROIDAB in the last 72 hours. Anemia Panel: No results for input(s): VITAMINB12, FOLATE, FERRITIN, TIBC, IRON, RETICCTPCT in the last 72 hours. Urine analysis: No results found for: COLORURINE, APPEARANCEUR, LABSPEC, PHURINE, GLUCOSEU, HGBUR, BILIRUBINUR, KETONESUR, PROTEINUR, UROBILINOGEN, NITRITE, LEUKOCYTESUR Sepsis Labs: !!!!!!!!!!!!!!!!!!!!!!!!!!!!!!!!!!!!!!!!!!!! @LABRCNTIP (procalcitonin:4,lacticidven:4) ) Recent  Results (from the past 240 hour(s))  Resp Panel by RT-PCR (Flu A&B, Covid) Nasopharyngeal Swab     Status: None   Collection Time: 06/15/20 12:47 PM   Specimen: Nasopharyngeal Swab; Nasopharyngeal(NP) swabs in vial transport medium  Result Value Ref Range Status   SARS Coronavirus 2 by RT PCR NEGATIVE NEGATIVE Final    Comment: (NOTE) SARS-CoV-2 target nucleic acids are NOT DETECTED.  The SARS-CoV-2 RNA is generally detectable in upper respiratory specimens during the acute phase of infection. The lowest concentration of SARS-CoV-2 viral copies this assay can detect is 138 copies/mL. A negative result does not preclude SARS-Cov-2 infection and should not be used as the sole basis for treatment or other patient management decisions. A negative result may occur with  improper specimen collection/handling, submission of specimen other than nasopharyngeal swab, presence of viral mutation(s) within the areas targeted by this assay, and inadequate number of viral copies(<138 copies/mL). A negative result must be combined  with clinical observations, patient history, and epidemiological information. The expected result is Negative.  Fact Sheet for Patients:  EntrepreneurPulse.com.au  Fact Sheet for Healthcare Providers:  IncredibleEmployment.be  This test is no t yet approved or cleared by the [place] FDA and  has been authorized for detection and/or diagnosis of SARS-CoV-2 by FDA under an Emergency Use Authorization (EUA). This EUA will remain  in effect (meaning this test can be used) for the duration of the COVID-19 declaration under Section 564(b)(1) of the Act, 21 U.S.C.section 360bbb-3(b)(1), unless the authorization is terminated  or revoked sooner.       Influenza A by PCR NEGATIVE NEGATIVE Final   Influenza B by PCR NEGATIVE NEGATIVE Final    Comment: (NOTE) The Xpert Xpress SARS-CoV-2/FLU/RSV plus assay is intended as an aid in the diagnosis of influenza from Nasopharyngeal swab specimens and should not be used as a sole basis for treatment. Nasal washings and aspirates are unacceptable for Xpert Xpress SARS-CoV-2/FLU/RSV testing.  Fact Sheet for Patients: EntrepreneurPulse.com.au  Fact Sheet for Healthcare Providers: IncredibleEmployment.be  This test is not yet approved or cleared by the [place] FDA and has been authorized for detection and/or diagnosis of SARS-CoV-2 by FDA under an Emergency Use Authorization (EUA). This EUA will remain in effect (meaning this test can be used) for the duration of the COVID-19 declaration under Section 564(b)(1) of the Act, 21 U.S.C. section 360bbb-3(b)(1), unless the authorization is terminated or revoked.  Performed at [facility], [place] [address] [place], [place] [address]      Radiological Exams on Admission: CT ABDOMEN PELVIS W CONTRAST  Result Date: 06/15/2020 CLINICAL DATA:  Abdominal pain. Fever. Nausea and vomiting. Diarrhea.  Crohn disease. Sickle cell disease. EXAM: CT ABDOMEN AND PELVIS WITH CONTRAST TECHNIQUE: Multidetector CT imaging of the abdomen and pelvis was performed using the standard protocol following bolus administration of intravenous contrast. CONTRAST:  147m OMNIPAQUE IOHEXOL 300 MG/ML  SOLN COMPARISON:  07/21/2016 FINDINGS: Lower Chest: No acute findings. Hepatobiliary: Tiny sub-cm low-attenuation lesion is seen near the junction the right and left lobes which is too small to characterize but most likely represents a tiny cyst. No other liver lesions identified. Gallbladder is unremarkable. No evidence of biliary ductal dilatation. Pancreas:  No mass or inflammatory changes. Spleen: Within normal limits in size and appearance. Adrenals/Urinary Tract: No masses identified. No evidence of ureteral calculi or hydronephrosis. Stomach/Bowel: Small bowel is unremarkable in appearance. Mild wall thickening, mucosal enhancement, and loss of normal haustral fold pattern is seen  involving the transverse, descending, and rectosigmoid colon, which is similar to prior exam, and consistent with mild chronic colitis. No evidence of abscess. A linear density is seen in the left perianal region extending along the left gluteal cleft. This is incompletely visualized on this exam but is consistent with a perianal fistula. Vascular/Lymphatic: No pathologically enlarged lymph nodes. No acute vascular findings. Reproductive:  No mass or other significant abnormality. Other:  None. Musculoskeletal:  No suspicious bone lesions identified. IMPRESSION: Mild chronic colitis involving the transverse, descending, and rectosigmoid colon, similar in appearance to prior study. No evidence of abscess or bowel obstruction. Left perianal fistula noted, but incompletely visualized on this exam. Nonemergent pelvic MRI without and with contrast could be performed for further evaluation. Electronically Signed   By: Marlaine Hind M.D.   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  Could be infectious.  GI panel pending.  We will hold off on steroids at this time and placed on Cipro and Flagyl.  GI consultation obtained and pending at this time.  We will hold off on steroids until GI evaluated.  Abdominal exam benign.  Active Problems:    Sickle cell trait (HCC)-noted    Elevated LFTs-unclear if this is associated with his recent medication started for his Crohn's.  We will continue to serial monitor.  GI consulted.    Further recommendations pending on overall hospital course   DVT prophylaxis: Ambulate Code Status: Full Family Communication: Mother Disposition Plan: 1 to 3 days Consults called: Gastroenterology Dr. Alessandra Bevels Admission status: Admit   Amit Leece A MD [facility]  If 7PM-7AM, please contact night-coverage [email or web address] Password [email or web address]  06/15/2020, 4:01 PM

## 2020-06-15 NOTE — ED Provider Notes (Signed)
  Physical Exam  BP 121/78   Pulse 100   Temp 98.4 F (36.9 C) (Oral)   Resp 18   SpO2 99%   Physical Exam  ED Course/Procedures     Procedures  MDM  Patient care assumed from Eloise Harman. PA-C at shift change, please see her note for a full HPI. Briefly, patient here with prior history of Crohn's disease on [place], sickle cell trait under the care of Dr. Renee Harder at [facility].  Patient presents here with 5 days worth of abdominal pain and URI symptoms.  Has been running a subjective fever, afebrile on arrival.  CMP with multiple electrolyte derangements.  LFTs remarkable for AST 241 and ALT 169, with a total bili of 6.5.  Normal kidney function.  Negative COVID and flu test.  CT abdomen was within normal limits.  Prior colleague consulted Dr. Newman Pies of GI over at [place] patient admitted to the hospital for fluid resuscitation and trending of his LFT.  Adding hepatitis panel along with a second liter of fluids along with a stool PCR.  He is hemodynamically stable, week for his GI is available if needed.  Will place call for hospitalist admission at this time.  3:40 PM Spoke to Dr. Shanon Brow who will like patient run by gastroenterology at [facility] long prior to admitting patient.  He remains hemodynamically stable.  3:49 PM spoke to gastroenterology Dr.Brahmbhatt, who is agreeable of consultation if needed.  We will repeat hospitalist Dr. Shanon Brow for hospital admission.   Portions of this note were generated with [employer]. Dictation errors may occur despite best attempts at proofreading.     Janeece Fitting, PA-C 06/15/20 1552    Arnaldo Natal, MD 06/15/20 [id number]

## 2020-06-15 NOTE — Progress Notes (Signed)
Pt arrived to room 1503 from ED.

## 2020-06-15 NOTE — ED Notes (Signed)
Mother at bedside.

## 2020-06-15 NOTE — ED Triage Notes (Signed)
Pt complains of body aches, n/v/d for the past 4-5 days. He has hx of Crohn's disease, has ileostomy. He receives weekly lactated ringers infusions. He has been taking tylenol and keeping down fluids.

## 2020-06-15 NOTE — ED Provider Notes (Addendum)
[place] DEPT Provider Note   CSN: [id number] Arrival date & time: 06/15/20  1141     History Chief Complaint  Patient presents with  . Generalized Body Aches  . Nausea  . Fever    Luke Richmond is a 20 y.o. male past medical history significant for Crohn's disease on [place], sickle cell trait. Followed by [facility] Dr. Renee Harder   HPI Patient presents to emergency department today with chief complaint of generalized body aches, nausea and fever x5 days.  T-max of 102 last night.  He has been taking Tylenol, last dose was 2 hours prior to arrival.  Patient describes generalized body aches as aching pain in his joints that is worse in his back and hips. He is also endorsing generalized abdominal pain.  Describes it as an aching and cramping sensation.  Pain has been intermittent.  He rates pain 8 out of 10 in severity.  He has associated nausea.  He had 2 episodes of emesis since symptom onset, none in the last 24 hours.  He states he initially had increased output into his ileostomy however that has calmed down with his decreased PO intake. The output is dark green in color and has been for the last 3 day. Usually output is light brown in color. Patient presented to urgent care yesterday for the same. It was recommended he be seen in the ED for further work up. He went home and attempted to manage symptoms however was unable to which prompted him to present to ED today. He denies any recent travel or antibiotic use. No recent camping or tick bite. Patient states he does not spend much time outside and there are no pets in the home.   Patient started [employer] last month.  He called GI to let them know about his symptoms and they recommend he hold off on his next injection until symptoms have resolved. He has routine liver function testing and denies any recent abnormal results.  They did not seem to think there was any association of his symptoms linked to  the medication per mother. Mother states she noticed his eyes look yellow.     Past Medical History:  Diagnosis Date  . Crohn disease ([place])    Symptoms 10/2013, diagnosed 02/2014, last remicade 513m 05/31/2015  . Sickle cell anemia ([facility])   . Sickle cell trait ([facility]     Patient Active Problem List   Diagnosis Date Noted  . Ingrown toenail 07/05/2018  . 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 Rectal pain 04/02/2017  . Perirectal abscess 02/02/2017  . Sickle cell trait ([facility] 10/01/2016  . Iron deficiency anemia   . Moderate malnutrition ([facility]   . Crohn's disease with complication ([facility]   . Diarrhea 01/12/2014  . Hematochezia 01/12/2014    History reviewed. No pertinent surgical history.     Family History  Problem Relation Age of Onset  . Glaucoma Mother   . Hypertension Father   . Diabetes Father        type II  . Diverticulitis Maternal Aunt   . Cancer Maternal Grandmother        colon cancer, maternal great grandmother  . Cancer Other   . Irritable bowel syndrome Other   . Diverticulitis Other     Social History   Tobacco Use  . Smoking status: Never Smoker  . Smokeless tobacco: Never Used  Substance Use Topics  . Alcohol use: No  .  Drug use: No    Home Medications Prior to Admission medications   Medication Sig Start Date End Date Taking? Authorizing Provider  acetaminophen (TYLENOL) 500 MG tablet Take 1,000 mg by mouth every 6 (six) hours as needed for moderate pain.   Yes [provider]  dicyclomine (BENTYL) 10 MG capsule Take 10 mg by mouth 3 (three) times daily as needed for spasms. 09/21/19  Yes [provider]  docusate sodium (COLACE) 100 MG capsule Take 100 mg by mouth daily as needed for mild constipation.   Yes [provider]  ferrous sulfate 325 (65 FE) MG tablet Take 325 mg by mouth daily with breakfast.   Yes [provider]  Multiple Vitamins-Minerals (MULTIVITAMIN ADULT PO) Take  1 tablet by mouth daily.   Yes [provider]  ondansetron (ZOFRAN-ODT) 4 MG disintegrating tablet Take 4 mg by mouth every 8 (eight) hours as needed for nausea. 12/25/17  Yes [provider]  Risankizumab-rzaa 150 MG/ML SOAJ Inject 150 mg into the vein every 28 (twenty-eight) days. 04/16/20  Yes [provider]  traMADol (ULTRAM) 50 MG tablet Take 50 mg by mouth every 12 (twelve) hours as needed for moderate pain. 04/13/17  Yes [provider]  cephALEXin (KEFLEX) 500 MG capsule Take 1 capsule (500 mg total) by mouth 3 (three) times daily. Patient not taking: No sig reported 08/07/19   Trula Slade, DPM  mupirocin ointment (BACTROBAN) 2 % Apply 1 application topically 2 (two) times daily. Patient not taking: No sig reported 05/02/18   Trula Slade, DPM    Allergies    Adalimumab, Infliximab, Peanut oil, and Peanut-containing drug products  Review of Systems   Review of Systems All other systems are reviewed and are negative for acute change except as noted in the HPI.  Physical Exam Updated Vital Signs BP 112/86 (BP Location: Left Arm)   Pulse (!) 106   Temp 98.4 F (36.9 C) (Oral)   Resp 18   SpO2 98%   Physical Exam Vitals and nursing note reviewed.  Constitutional:      General: He is not in acute distress.    Appearance: He is not ill-appearing.  HENT:     Head: Normocephalic and atraumatic.     Right Ear: Tympanic membrane and external ear normal.     Left Ear: Tympanic membrane and external ear normal.     Nose: Nose normal.     Mouth/Throat:     Mouth: Mucous membranes are dry.     Pharynx: Oropharynx is clear. No oropharyngeal exudate or posterior oropharyngeal erythema.     Comments: Gums are pale Eyes:     General: Scleral icterus present.        Right eye: No discharge.        Left eye: No discharge.     Extraocular Movements: Extraocular movements intact.     Conjunctiva/sclera: Conjunctivae normal.     Pupils:  Pupils are equal, round, and reactive to light.  Neck:     Vascular: No JVD.  Cardiovascular:     Rate and Rhythm: Regular rhythm. Tachycardia present.     Pulses: Normal pulses.          Radial pulses are 2+ on the right side and 2+ on the left side.     Heart sounds: Normal heart sounds.  Pulmonary:     Comments: Lungs clear to auscultation in all fields. Symmetric chest rise. No wheezing, rales, or rhonchi. Abdominal:  Comments: Abdomen is soft, non-distended. Ileostomy present in RUQ with dark green out put in bad. Tender to palpation in LLQ with voluntary guarding. No rigidity. No peritoneal signs.  Musculoskeletal:        General: Normal range of motion.     Cervical back: Normal range of motion.  Skin:    General: Skin is warm and dry.     Capillary Refill: Capillary refill takes less than 2 seconds.  Neurological:     Mental Status: He is oriented to person, place, and time.     GCS: GCS eye subscore is 4. GCS verbal subscore is 5. GCS motor subscore is 6.     Comments: Fluent speech, no facial droop.  Psychiatric:        Behavior: Behavior normal.     ED Results / Procedures / Treatments   Labs (all labs ordered are listed, but only abnormal results are displayed) Labs Reviewed  CBC WITH DIFFERENTIAL/PLATELET - Abnormal; Notable for the following components:      Result Value   WBC 2.3 (*)    RBC 7.86 (*)    MCV 62.0 (*)    MCH 19.7 (*)    RDW 19.7 (*)    Lymphs Abs 0.4 (*)    All other components within normal limits  COMPREHENSIVE METABOLIC PANEL - Abnormal; Notable for the following components:   Sodium 126 (*)    Chloride 91 (*)    Glucose, Bld 116 (*)    Calcium 8.8 (*)    Total Protein 8.3 (*)    AST 241 (*)    ALT 169 (*)    Alkaline Phosphatase 193 (*)    Total Bilirubin 6.5 (*)    All other components within normal limits  RESP PANEL BY RT-PCR (FLU A&B, COVID) ARPGX2  GASTROINTESTINAL PANEL BY PCR, STOOL (REPLACES STOOL CULTURE)  LIPASE, BLOOD   ACETAMINOPHEN LEVEL  URINALYSIS, ROUTINE W REFLEX MICROSCOPIC  HEPATITIS PANEL, ACUTE    EKG None  Radiology CT ABDOMEN PELVIS W CONTRAST  Result Date: 06/15/2020 CLINICAL DATA:  Abdominal pain. Fever. Nausea and vomiting. Diarrhea. Crohn disease. Sickle cell disease. EXAM: CT ABDOMEN AND PELVIS WITH CONTRAST TECHNIQUE: Multidetector CT imaging of the abdomen and pelvis was performed using the standard protocol following bolus administration of intravenous contrast. CONTRAST:  172m OMNIPAQUE IOHEXOL 300 MG/ML  SOLN COMPARISON:  07/21/2016 FINDINGS: Lower Chest: No acute findings. Hepatobiliary: Tiny sub-cm low-attenuation lesion is seen near the junction the right and left lobes which is too small to characterize but most likely represents a tiny cyst. No other liver lesions identified. Gallbladder is unremarkable. No evidence of biliary ductal dilatation. Pancreas:  No mass or inflammatory changes. Spleen: Within normal limits in size and appearance. Adrenals/Urinary Tract: No masses identified. No evidence of ureteral calculi or hydronephrosis. Stomach/Bowel: Small bowel is unremarkable in appearance. Mild wall thickening, mucosal enhancement, and loss of normal haustral fold pattern is seen involving the transverse, descending, and rectosigmoid colon, which is similar to prior exam, and consistent with mild chronic colitis. No evidence of abscess. A linear density is seen in the left perianal region extending along the left gluteal cleft. This is incompletely visualized on this exam but is consistent with a perianal fistula. Vascular/Lymphatic: No pathologically enlarged lymph nodes. No acute vascular findings. Reproductive:  No mass or other significant abnormality. Other:  None. Musculoskeletal:  No suspicious bone lesions identified. IMPRESSION: Mild chronic colitis involving the transverse, descending, and rectosigmoid colon, similar in appearance to prior  study. No evidence of abscess or bowel  obstruction. Left perianal fistula noted, but incompletely visualized on this exam. Nonemergent pelvic MRI without and with contrast could be performed for further evaluation. Electronically Signed   By: Marlaine Hind M.D.   On: 06/15/2020 14:12    Procedures Procedures   Medications Ordered in ED Medications  sodium chloride 0.9 % bolus 1,000 mL (has no administration in time range)  sodium chloride 0.9 % bolus 1,000 mL (0 mLs Intravenous Stopped 06/15/20 1358)  ondansetron (ZOFRAN) injection 4 mg (4 mg Intravenous Given 06/15/20 1242)  morphine 4 MG/ML injection 4 mg (4 mg Intravenous Given 06/15/20 1242)  iohexol (OMNIPAQUE) 300 MG/ML solution 100 mL (100 mLs Intravenous Contrast Given 06/15/20 1332)    ED Course  I have reviewed the triage vital signs and the nursing notes.  Pertinent labs & imaging results that were available during my care of the patient were reviewed by me and considered in my medical decision making (see chart for details).    MDM Rules/Calculators/A&P                          History provided by patient with additional history obtained from chart review.    Patient presenting with abdominal pain and URI symptoms.  On ED arrival he is afebrile, he is tachycardic to 106, normotensive.  On exam patient does not look toxic.  He looks dehydrated, mucous membranes are dry.  His gums are pale.  He also has bilateral scleral icterus.  He has generalized abdominal tenderness that is worse in the left lower quadrant.  No peritoneal signs.  Ileostomy bag has dark green output. NO emesis in the last 24 hours Work-up initiated with labs and CT abdomen pelvis.  CBC with leukopenia, white count of 2.3 with normal hemoglobin, normal platelets.  Lipase within normal range.  CMP with multiple abnormalities including hyponatremia 126, transaminitis with AST of 241 ALT of 169.  Alk phos elevated at 193.  Total bilirubin also elevated at 6.5. Normal kidney function.  COVID and flu tests  are negative. CT abdomen pelvis without acute findings.  Patient's chronic colitis.  Discussed case with ED attending Dr. Eulis Foster who agrees with plan for GI consult.  Patient is established with Dr. Renee Harder at [facility].  Consult has been ordered.  Consulted on call attending Dr. Newman Pies.  He recommends patient be admitted to the hospital for fluid resuscitation and trending of his LFTs.  He recommends adding on hepatitis panel which has been ordered.  Second liter fluids of been ordered as well.  He requested stool PCR testing as well.  Patient is stable to be admitted to the hospital here.  GI is available for consult if needed.  Patient care transferred to J. Soto PA-C at the end of my shift pending hospitalist consult for admission.     Portions of this note were generated with [employer]. Dictation errors may occur despite best attempts at proofreading.   Final Clinical Impression(s) / ED Diagnoses Final diagnoses:  Generalized abdominal pain  Transaminitis    Rx / DC Orders ED Discharge Orders    None       Barrie Folk, PA-C 06/15/20 1516    Barrie Folk, PA-C 06/15/20 1528    Daleen Bo, MD 06/15/20 1702

## 2020-06-16 DIAGNOSIS — R7989 Other specified abnormal findings of blood chemistry: Secondary | ICD-10-CM

## 2020-06-16 DIAGNOSIS — D573 Sickle-cell trait: Secondary | ICD-10-CM

## 2020-06-16 LAB — COMPREHENSIVE METABOLIC PANEL
ALT: 168 U/L — ABNORMAL HIGH (ref 0–44)
AST: 204 U/L — ABNORMAL HIGH (ref 15–41)
Albumin: 2.7 g/dL — ABNORMAL LOW (ref 3.5–5.0)
Alkaline Phosphatase: 174 U/L — ABNORMAL HIGH (ref 38–126)
Anion gap: 9 (ref 5–15)
BUN: 7 mg/dL (ref 6–20)
CO2: 24 mmol/L (ref 22–32)
Calcium: 8.3 mg/dL — ABNORMAL LOW (ref 8.9–10.3)
Chloride: 98 mmol/L (ref 98–111)
Creatinine, Ser: 0.72 mg/dL (ref 0.61–1.24)
GFR, Estimated: >60 mL/min (ref >60)
Glucose, Bld: 108 mg/dL — ABNORMAL HIGH (ref 70–99)
Potassium: 4.3 mmol/L (ref 3.5–5.1)
Sodium: 131 mmol/L — ABNORMAL LOW (ref 135–145)
Total Bilirubin: 4.1 mg/dL — ABNORMAL HIGH (ref 0.3–1.2)
Total Protein: 6.5 g/dL (ref 6.5–8.1)

## 2020-06-16 LAB — CBC
HCT: 39 % (ref 39.0–52.0)
Hemoglobin: 12.6 g/dL — ABNORMAL LOW (ref 13.0–17.0)
MCH: 20.1 pg — ABNORMAL LOW (ref 26.0–34.0)
MCHC: 32.3 g/dL (ref 30.0–36.0)
MCV: 62.2 fL — ABNORMAL LOW (ref 80.0–100.0)
Platelets: 136 10*3/uL — ABNORMAL LOW (ref 150–400)
RBC: 6.27 MIL/uL — ABNORMAL HIGH (ref 4.22–5.81)
RDW: 18.6 % — ABNORMAL HIGH (ref 11.5–15.5)
WBC: 4.2 10*3/uL (ref 4.0–10.5)
nRBC: 0 % (ref 0.0–0.2)

## 2020-06-16 LAB — GASTROINTESTINAL PANEL BY PCR, STOOL (REPLACES STOOL CULTURE)

## 2020-06-16 MED ORDER — MELATONIN 5 MG PO TABS
5.0000 mg | ORAL_TABLET | Freq: Every evening | ORAL | Status: DC | PRN
  Administered 2020-06-17 – 2020-06-19 (×3): 5 mg via ORAL
  Filled 2020-06-16 (×3): qty 1

## 2020-06-16 MED ORDER — ACETAMINOPHEN 500 MG PO TABS
1000.0000 mg | ORAL_TABLET | Freq: Four times a day (QID) | ORAL | Status: DC | PRN
  Administered 2020-06-16 – 2020-06-20 (×8): 1000 mg via ORAL
  Filled 2020-06-16 (×9): qty 2

## 2020-06-16 MED ORDER — LORAZEPAM 2 MG/ML IJ SOLN
0.5000 mg | Freq: Once | INTRAMUSCULAR | Status: AC
  Administered 2020-06-16: 0.5 mg via INTRAVENOUS
  Filled 2020-06-16: qty 1

## 2020-06-16 NOTE — Progress Notes (Signed)
Attending notified this shift that pt was febrile at 101.8, yellow MEWS initiated  And guidelines implemented. Interventions were  ice packs and tylenol which decreased temp. No new orders

## 2020-06-16 NOTE — Progress Notes (Signed)
PROGRESS NOTE    Luke Richmond  QIO:[id number] DOB: [birth year] DOA: 06/15/2020 PCP: Karleen Dolphin, MD    Brief Narrative:  20 y.o. male with medical history significant of Crohn's disease gets his care at [facility] started on risankizumab approximately 6 weeks ago status post ileostomy history of fistula reports that his imaging since his surgery in the last year have been somewhat normal at [facility] for 5 days has been having fever nausea vomiting diarrhea.  Denies any sick contacts.  He is having some abdominal pain in the left lower quadrant.  He reports he is not frequently on steroids and has only been to the emergency department once in the last year and only hospitalized once in the last year for his surgery.  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 Seems somewhat improved this AM. Cont to follow  Fever -Tmax of 101.48F -Cont tylenol as needed. Cont abx per above -cont to follow culture results  DVT prophylaxis: SCD's Code Status: Full Family Communication: Pt in room, family is at bedside  Status is: Inpatient  Remains inpatient appropriate because:Inpatient level of care appropriate due to severity of illness   Dispo: The patient is from: Home              Anticipated d/c is to: Home              Patient currently is not medically  stable to d/c.   Difficult to place patient No       Consultants:   GI  Procedures:     Antimicrobials: Anti-infectives (From admission, onward)   Start     Dose/Rate Route Frequency Ordered Stop   06/15/20 1700  cefTRIAXone (ROCEPHIN) 2 g in sodium chloride 0.9 % 100 mL IVPB        2 g 200 mL/hr over 30 Minutes Intravenous Every 24 hours 06/15/20 1622     06/15/20 1630  metroNIDAZOLE (FLAGYL) IVPB 500 mg        500 mg 100 mL/hr over 60 Minutes Intravenous Every 8 hours 06/15/20 1612     06/15/20 1615  ciprofloxacin (CIPRO) IVPB 400 mg  Status:  Discontinued        400 mg 200 mL/hr over 60 Minutes Intravenous Every 12 hours 06/15/20 1612 06/15/20 1622       Subjective: Reports feeling better  Objective: Vitals:   06/16/20 0425 06/16/20 0518 06/16/20 0936 06/16/20 1353  BP:  118/73 118/77 110/69  Pulse:  96 98 (!) 101  Resp:  16 16 17   Temp: 99.1 F (37.3 C) 98.7 F (37.1 C) 98.8 F (37.1 C) 100 F (37.8 C)  TempSrc: Oral Oral  Oral  SpO2:  100% 99% 99%  Weight:      Height:        Intake/Output Summary (Last 24 hours) at 06/16/2020 1437 Last  data filed at 06/16/2020 0900 Gross per 24 hour  Intake 1802.01 ml  Output 680 ml  Net 1122.01 ml   Filed Weights   06/15/20 1804  Weight: 59 kg    Examination: General exam: Awake, laying in bed, in nad Respiratory system: Normal respiratory effort, no wheezing Cardiovascular system: regular rate, s1, s2 Gastrointestinal system: Soft, nondistended, positive BS Central nervous system: CN2-12 grossly intact, strength intact Extremities: Perfused, no clubbing Skin: Normal skin turgor, no notable skin lesions seen Psychiatry: Mood normal // no visual hallucinations    Data Reviewed: I have personally reviewed following labs and imaging studies  CBC: Recent Labs  Lab 06/15/20 1247 06/16/20 0748  WBC 2.3* 4.2  NEUTROABS 1.7  --   HGB 15.5 12.6*  HCT 48.7 39.0  MCV 62.0* 62.2*  PLT 220 136*   Basic  Metabolic Panel: Recent Labs  Lab 06/15/20 1247 06/16/20 0748  NA 126* 131*  K 4.0 4.3  CL 91* 98  CO2 24 24  GLUCOSE 116* 108*  BUN 12 7  CREATININE 0.87 0.72  CALCIUM 8.8* 8.3*   GFR: Estimated Creatinine Clearance: 121.9 mL/min (by C-G formula based on SCr of 0.72 mg/dL). Liver Function Tests: Recent Labs  Lab 06/15/20 1247 06/16/20 0748  AST 241* 204*  ALT 169* 168*  ALKPHOS 193* 174*  BILITOT 6.5* 4.1*  PROT 8.3* 6.5  ALBUMIN 3.5 2.7*   Recent Labs  Lab 06/15/20 1247  LIPASE 21   No results for input(s): AMMONIA in the last 168 hours. Coagulation Profile: No results for input(s): INR, PROTIME in the last 168 hours. Cardiac Enzymes: No results for input(s): CKTOTAL, CKMB, CKMBINDEX, TROPONINI in the last 168 hours. BNP (last 3 results) No results for input(s): PROBNP in the last 8760 hours. HbA1C: No results for input(s): HGBA1C in the last 72 hours. CBG: No results for input(s): GLUCAP in the last 168 hours. Lipid Profile: No results for input(s): CHOL, HDL, LDLCALC, TRIG, CHOLHDL, LDLDIRECT in the last 72 hours. Thyroid Function Tests: No results for input(s): TSH, T4TOTAL, FREET4, T3FREE, THYROIDAB in the last 72 hours. Anemia Panel: No results for input(s): VITAMINB12, FOLATE, FERRITIN, TIBC, IRON, RETICCTPCT in the last 72 hours. Sepsis Labs: No results for input(s): PROCALCITON, LATICACIDVEN in the last 168 hours.  Recent Results (from the past 240 hour(s))  Resp Panel by RT-PCR (Flu A&B, Covid) Nasopharyngeal Swab     Status: None   Collection Time: 06/15/20 12:47 PM   Specimen: Nasopharyngeal Swab; Nasopharyngeal(NP) swabs in vial transport medium  Result Value Ref Range Status   SARS Coronavirus 2 by RT PCR NEGATIVE NEGATIVE Final    Comment: (NOTE) SARS-CoV-2 target nucleic acids are NOT DETECTED.  The SARS-CoV-2 RNA is generally detectable in upper respiratory specimens during the acute phase of infection. The lowest concentration of  SARS-CoV-2 viral copies this assay can detect is 138 copies/mL. A negative result does not preclude SARS-Cov-2 infection and should not be used as the sole basis for treatment or other patient management decisions. A negative result may occur with  improper specimen collection/handling, submission of specimen other than nasopharyngeal swab, presence of viral mutation(s) within the areas targeted by this assay, and inadequate number of viral copies(<138 copies/mL). A negative result must be combined with clinical observations, patient history, and epidemiological information. The expected result is Negative.  Fact Sheet for Patients:  EntrepreneurPulse.com.au  Fact Sheet for Healthcare Providers:  IncredibleEmployment.be  This test is no t yet approved or cleared by the [place]  States FDA and  has been authorized for detection and/or diagnosis of SARS-CoV-2 by FDA under an Emergency Use Authorization (EUA). This EUA will remain  in effect (meaning this test can be used) for the duration of the COVID-19 declaration under Section 564(b)(1) of the Act, 21 U.S.C.section 360bbb-3(b)(1), unless the authorization is terminated  or revoked sooner.       Influenza A by PCR NEGATIVE NEGATIVE Final   Influenza B by PCR NEGATIVE NEGATIVE Final    Comment: (NOTE) The Xpert Xpress SARS-CoV-2/FLU/RSV plus assay is intended as an aid in the diagnosis of influenza from Nasopharyngeal swab specimens and should not be used as a sole basis for treatment. Nasal washings and aspirates are unacceptable for Xpert Xpress SARS-CoV-2/FLU/RSV testing.  Fact Sheet for Patients: EntrepreneurPulse.com.au  Fact Sheet for Healthcare Providers: IncredibleEmployment.be  This test is not yet approved or cleared by the [place] FDA and has been authorized for detection and/or diagnosis of SARS-CoV-2 by FDA under an Emergency Use  Authorization (EUA). This EUA will remain in effect (meaning this test can be used) for the duration of the COVID-19 declaration under Section 564(b)(1) of the Act, 21 U.S.C. section 360bbb-3(b)(1), unless the authorization is terminated or revoked.  Performed at [facility], [place] [address] [place], [place] [address]   Gastrointestinal Panel by PCR , Stool     Status: None   Collection Time: 06/15/20  3:24 PM   Specimen: Ileostomy; Stool  Result Value Ref Range Status   Campylobacter species NOT DETECTED NOT DETECTED Final   Plesimonas shigelloides NOT DETECTED NOT DETECTED Final   Salmonella species NOT DETECTED NOT DETECTED Final   Yersinia enterocolitica NOT DETECTED NOT DETECTED Final   Vibrio species NOT DETECTED NOT DETECTED Final   Vibrio cholerae NOT DETECTED NOT DETECTED Final   Enteroaggregative E coli (EAEC) NOT DETECTED NOT DETECTED Final   Enteropathogenic E coli (EPEC) NOT DETECTED NOT DETECTED Final   Enterotoxigenic E coli (ETEC) NOT DETECTED NOT DETECTED Final   Shiga like toxin producing E coli (STEC) NOT DETECTED NOT DETECTED Final   Shigella/Enteroinvasive E coli (EIEC) NOT DETECTED NOT DETECTED Final   Cryptosporidium NOT DETECTED NOT DETECTED Final   Cyclospora cayetanensis NOT DETECTED NOT DETECTED Final   Entamoeba histolytica NOT DETECTED NOT DETECTED Final   Giardia lamblia NOT DETECTED NOT DETECTED Final   Adenovirus F40/41 NOT DETECTED NOT DETECTED Final   Astrovirus NOT DETECTED NOT DETECTED Final   Norovirus GI/GII NOT DETECTED NOT DETECTED Final   Rotavirus A NOT DETECTED NOT DETECTED Final   Sapovirus (I, II, IV, and V) NOT DETECTED NOT DETECTED Final    Comment: Performed at [facility], [address] [place],  [address]     Radiology Studies: CT ABDOMEN PELVIS W CONTRAST  Result Date: 06/15/2020 CLINICAL DATA:  Abdominal pain. Fever. Nausea and vomiting. Diarrhea. Crohn disease. Sickle cell disease.  EXAM: CT ABDOMEN AND PELVIS WITH CONTRAST TECHNIQUE: Multidetector CT imaging of the abdomen and pelvis was performed using the standard protocol following bolus administration of intravenous contrast. CONTRAST:  122m OMNIPAQUE IOHEXOL 300 MG/ML  SOLN COMPARISON:  07/21/2016 FINDINGS: Lower Chest: No acute findings. Hepatobiliary: Tiny sub-cm low-attenuation lesion is seen near the junction the right and left lobes which is too small to characterize but most likely represents a tiny cyst. No other liver lesions identified. Gallbladder is unremarkable. No evidence of biliary ductal dilatation. Pancreas:  No mass or inflammatory changes. Spleen: Within normal limits in size  and appearance. Adrenals/Urinary Tract: No masses identified. No evidence of ureteral calculi or hydronephrosis. Stomach/Bowel: Small bowel is unremarkable in appearance. Mild wall thickening, mucosal enhancement, and loss of normal haustral fold pattern is seen involving the transverse, descending, and rectosigmoid colon, which is similar to prior exam, and consistent with mild chronic colitis. No evidence of abscess. A linear density is seen in the left perianal region extending along the left gluteal cleft. This is incompletely visualized on this exam but is consistent with a perianal fistula. Vascular/Lymphatic: No pathologically enlarged lymph nodes. No acute vascular findings. Reproductive:  No mass or other significant abnormality. Other:  None. Musculoskeletal:  No suspicious bone lesions identified. IMPRESSION: Mild chronic colitis involving the transverse, descending, and rectosigmoid colon, similar in appearance to prior study. No evidence of abscess or bowel obstruction. Left perianal fistula noted, but incompletely visualized on this exam. Nonemergent pelvic MRI without and with contrast could be performed for further evaluation. Electronically Signed   By: Marlaine Hind M.D.   On: 06/15/2020 14:12    Scheduled Meds: . feeding  supplement  237 mL Oral BID BM  . feeding supplement  237 mL Oral BID BM  . ferrous sulfate  325 mg Oral Q breakfast   Continuous Infusions: . 0.9 % NaCl with KCl 20 mEq / L 100 mL/hr at 06/16/20 1341  . cefTRIAXone (ROCEPHIN)  IV Stopped (06/15/20 1700)  . metronidazole 500 mg (06/16/20 1121)     LOS: 1 day   Marylu Lund, MD [facility] Pager On Amion  If 7PM-7AM, please contact night-coverage 06/16/2020, 2:37 PM

## 2020-06-16 NOTE — Progress Notes (Signed)
   06/16/20 0119  Assess: MEWS Score  Temp (!) 101.8 F (38.8 C)  BP 101/64  Pulse Rate (!) 109  Resp 17  Level of Consciousness Alert  SpO2 100 %  Assess: MEWS Score  MEWS Temp 2  MEWS Systolic 0  MEWS Pulse 1  MEWS RR 0  MEWS LOC 0  MEWS Score 3  MEWS Score Color Yellow  Assess: if the MEWS score is Yellow or Red  Were vital signs taken at a resting state? Yes  Focused Assessment No change from prior assessment  Early Detection of Sepsis Score *See Row Information* Medium  MEWS guidelines implemented *See Row Information* Yes  Treat  MEWS Interventions Administered prn meds/treatments  Take Vital Signs  Increase Vital Sign Frequency  Yellow: Q 2hr X 2 then Q 4hr X 2, if remains yellow, continue Q 4hrs  Escalate  MEWS: Escalate Yellow: discuss with charge nurse/RN and consider discussing with provider and RRT  Notify: Charge Nurse/RN  Name of Charge Nurse/RN Notified self  Date Charge Nurse/RN Notified 06/16/20  Time Charge Nurse/RN Notified 0300  Document  Patient Outcome Stabilized after interventions  Progress note created (see row info) Yes   Pt made aware of temperature and that ice packs will be applied. No complaint of pain. Focused assessment completed, PRN given and informed of reassessment to be done shortly. Will continue to monitor

## 2020-06-16 NOTE — Consult Note (Addendum)
Referring Provider:  [facility] Primary Care Physician:  Karleen Dolphin, MD Primary Gastroenterologist:  [facility]   Reason for Consultation: Nausea, vomiting, fever, history of complicated Crohn's disease  HPI: Luke Richmond is a 20 y.o. male with past medical history of complicated and refractory Crohn's disease, status post diverting loop ileostomy in 2021, with anal ulceration and perianal fistula, initially diagnosed in 2015, failed mesalamine, azathioprine, methotrexate, Remicade, Humira, Entyvio,Cimzia and Stelara.  Was started on Skyrizi (not yet FDA approved for Crohn's disease) in February 2022 with last infusion on May 14, 2020.  Patient was doing fine after starting Skyrizi but around 7 to 10 days ago he started noticing nausea, vomiting and body aches.  He was also complaining of hip pain.  Subsequently started having weakness and he presented to the hospital.  He had also noticed jaundiced few days ago.  ER physician contacted his GI doctor at [facility] and they had recommended transfer but because of lack of bed availability he was admitted here for IV hydration and further work-up.  Upon initial evaluation here, he was found to have elevated LFTs with T bili of 6.5, alk phos 193, AST 241 and ALT 169.  Hepatitis panel negative.  He was started on ciprofloxacin and Flagyl.  GI pathogen panel pending.  Patient seen and examined at bedside today.  Patient's mother was also at bedside.  Patient is feeling better today.  His nausea and vomiting has improved.  Body ache has also improved.  Complaining of mild left upper quadrant discomfort today.  Denies any blood in the stool or black stool.   Past Medical History:  Diagnosis Date  . Crohn disease ([place])    Symptoms 10/2013, diagnosed 02/2014, last remicade 551m 05/31/2015  . Sickle cell anemia (HCC)   . Sickle cell trait ([place]     History reviewed. No pertinent surgical history.  Prior to Admission medications   Medication Sig Start Date  End Date Taking? Authorizing Provider  acetaminophen (TYLENOL) 500 MG tablet Take 1,000 mg by mouth every 6 (six) hours as needed for moderate pain.   Yes [provider]  dicyclomine (BENTYL) 10 MG capsule Take 10 mg by mouth 3 (three) times daily as needed for spasms. 09/21/19  Yes [provider]  docusate sodium (COLACE) 100 MG capsule Take 100 mg by mouth daily as needed for mild constipation.   Yes [provider]  ferrous sulfate 325 (65 FE) MG tablet Take 325 mg by mouth daily with breakfast.   Yes [provider]  Multiple Vitamins-Minerals (MULTIVITAMIN ADULT PO) Take 1 tablet by mouth daily.   Yes [provider]  ondansetron (ZOFRAN-ODT) 4 MG disintegrating tablet Take 4 mg by mouth every 8 (eight) hours as needed for nausea. 12/25/17  Yes [provider]  Risankizumab-rzaa 150 MG/ML SOAJ Inject 150 mg into the vein every 28 (twenty-eight) days. 04/16/20  Yes [provider]  traMADol (ULTRAM) 50 MG tablet Take 50 mg by mouth every 12 (twelve) hours as needed for moderate pain. 04/13/17  Yes [provider]  cephALEXin (KEFLEX) 500 MG capsule Take 1 capsule (500 mg total) by mouth 3 (three) times daily. Patient not taking: No sig reported 08/07/19   WTrula Slade DPM  mupirocin ointment (BACTROBAN) 2 % Apply 1 application topically 2 (two) times daily. Patient not taking: No sig reported 05/02/18   WTrula Slade DPM    Scheduled Meds: . feeding supplement  237 mL Oral BID BM  .  feeding supplement  237 mL Oral BID BM  . ferrous sulfate  325 mg Oral Q breakfast   Continuous Infusions: . 0.9 % NaCl with KCl 20 mEq / L Stopped (06/16/20 0336)  . cefTRIAXone (ROCEPHIN)  IV Stopped (06/15/20 1700)  . metronidazole 100 mL/hr at 06/16/20 0432   PRN Meds:.acetaminophen, dicyclomine, docusate sodium, HYDROmorphone (DILAUDID) injection, ondansetron **OR** ondansetron (ZOFRAN) IV, traMADol  Allergies as of  06/15/2020 - Review Complete 06/15/2020  Allergen Reaction Noted  . Adalimumab  10/01/2016  . Infliximab Other (See Comments) 07/21/2016  . Peanut oil  02/02/2017  . Peanut-containing drug products  12/25/2016    Family History  Problem Relation Age of Onset  . Glaucoma Mother   . Hypertension Father   . Diabetes Father        type II  . Diverticulitis Maternal Aunt   . Cancer Maternal Grandmother        colon cancer, maternal great grandmother  . Cancer Other   . Irritable bowel syndrome Other   . Diverticulitis Other     Social History   Socioeconomic History  . Marital status: Single    Spouse name: Not on file  . Number of children: Not on file  . Years of education: Not on file  . Highest education level: Not on file  Occupational History  . Not on file  Tobacco Use  . Smoking status: Never Smoker  . Smokeless tobacco: Never Used  Substance and Sexual Activity  . Alcohol use: No  . Drug use: No  . Sexual activity: Never    Birth control/protection: Abstinence  Other Topics Concern  . Not on file  Social History Narrative   Lives at home with mother.  1 dog.  No smoke exposure.   Social Determinants of Health   Financial Resource Strain: Not on file  Food Insecurity: Not on file  Transportation Needs: Not on file  Physical Activity: Not on file  Stress: Not on file  Social Connections: Not on file  Intimate Partner Violence: Not on file    Review of Systems: 12 point review of system is done which is negative except as mentioned in HPI.  Physical Exam: Vital signs: Vitals:   06/16/20 0425 06/16/20 0518  BP:  118/73  Pulse:  96  Resp:  16  Temp: 99.1 F (37.3 C) 98.7 F (37.1 C)  SpO2:  100%   Last BM Date: 06/15/20 Physical Exam Vitals and nursing note reviewed.  Constitutional:      Appearance: Normal appearance. He is not ill-appearing.  HENT:     Head: Normocephalic and atraumatic.     Nose: Nose normal.     Mouth/Throat:      Mouth: Mucous membranes are moist.     Pharynx: Oropharynx is clear. No oropharyngeal exudate.  Eyes:     General: Scleral icterus present.     Extraocular Movements: Extraocular movements intact.  Cardiovascular:     Rate and Rhythm: Normal rate and regular rhythm.     Heart sounds: No murmur heard.   Pulmonary:     Effort: Pulmonary effort is normal. No respiratory distress.     Breath sounds: Normal breath sounds.  Abdominal:     General: Bowel sounds are normal. There is no distension.     Palpations: Abdomen is soft.     Tenderness: There is no abdominal tenderness. There is no guarding.     Comments: Ileostomy present  Musculoskeletal:  General: No swelling.     Right lower leg: No edema.     Left lower leg: No edema.  Skin:    General: Skin is warm.     Findings: No erythema.  Neurological:     Mental Status: He is alert and oriented to person, place, and time.  Psychiatric:        Mood and Affect: Mood normal.        Behavior: Behavior normal.        Thought Content: Thought content normal.        Judgment: Judgment normal.    GI:  Lab Results: Recent Labs    06/15/20 1247 06/16/20 0748  WBC 2.3* 4.2  HGB 15.5 12.6*  HCT 48.7 39.0  PLT 220 136*   BMET Recent Labs    06/15/20 1247 06/16/20 0748  NA 126* 131*  K 4.0 4.3  CL 91* 98  CO2 24 24  GLUCOSE 116* 108*  BUN 12 7  CREATININE 0.87 0.72  CALCIUM 8.8* 8.3*   LFT Recent Labs    06/16/20 0748  PROT 6.5  ALBUMIN 2.7*  AST 204*  ALT 168*  ALKPHOS 174*  BILITOT 4.1*   PT/INR No results for input(s): LABPROT, INR in the last 72 hours.   Studies/Results: CT ABDOMEN PELVIS W CONTRAST  Result Date: 06/15/2020 CLINICAL DATA:  Abdominal pain. Fever. Nausea and vomiting. Diarrhea. Crohn disease. Sickle cell disease. EXAM: CT ABDOMEN AND PELVIS WITH CONTRAST TECHNIQUE: Multidetector CT imaging of the abdomen and pelvis was performed using the standard protocol following bolus  administration of intravenous contrast. CONTRAST:  141m OMNIPAQUE IOHEXOL 300 MG/ML  SOLN COMPARISON:  07/21/2016 FINDINGS: Lower Chest: No acute findings. Hepatobiliary: Tiny sub-cm low-attenuation lesion is seen near the junction the right and left lobes which is too small to characterize but most likely represents a tiny cyst. No other liver lesions identified. Gallbladder is unremarkable. No evidence of biliary ductal dilatation. Pancreas:  No mass or inflammatory changes. Spleen: Within normal limits in size and appearance. Adrenals/Urinary Tract: No masses identified. No evidence of ureteral calculi or hydronephrosis. Stomach/Bowel: Small bowel is unremarkable in appearance. Mild wall thickening, mucosal enhancement, and loss of normal haustral fold pattern is seen involving the transverse, descending, and rectosigmoid colon, which is similar to prior exam, and consistent with mild chronic colitis. No evidence of abscess. A linear density is seen in the left perianal region extending along the left gluteal cleft. This is incompletely visualized on this exam but is consistent with a perianal fistula. Vascular/Lymphatic: No pathologically enlarged lymph nodes. No acute vascular findings. Reproductive:  No mass or other significant abnormality. Other:  None. Musculoskeletal:  No suspicious bone lesions identified. IMPRESSION: Mild chronic colitis involving the transverse, descending, and rectosigmoid colon, similar in appearance to prior study. No evidence of abscess or bowel obstruction. Left perianal fistula noted, but incompletely visualized on this exam. Nonemergent pelvic MRI without and with contrast could be performed for further evaluation. Electronically Signed   By: JMarlaine HindM.D.   On: 06/15/2020 14:12    Impression/Plan: -Nausea, vomiting, fever in a patient with complicated Crohn's disease.  CT abdomen pelvis showed mild chronic colitis similar to prior study as well as left perianal  fistula. - complicated and refractory Crohn's disease, status post diverting loop ileostomy in 2021, with anal ulceration and perianal fistula, initially diagnosed in 2015, failed mesalamine, azathioprine, methotrexate, Remicade, Humira, Entyvio,Cimzia and Stelara.  Was started on Skyrizi in February 2022 with last  infusion on May 14, 2020. -Abnormal LFTs.  Improving.  Hepatitis panel negative -Sickle cell trait  Recommendations ------------------------- -Follow GI pathogen panel -Continue IV antibiotics for now -Agree with holding steroids for now -Continue full liquid diet and advance to soft as tolerated -Recommend transfer to [facility] for management of his complicated Crohn's disease. -Patient with known perianal fistula and I do not think there is a need for urgent pelvic MRI at this time. -GI will follow  Patient with complicated Crohn's disease.  Extensive medical records reviewed from care everywhere.   LOS: 1 day   Otis Brace  MD, FACP 06/16/2020, 8:53 AM  Contact #  [phone]

## 2020-06-16 NOTE — Progress Notes (Signed)
   06/16/20 2108  Assess: MEWS Score  Temp (!) 103.1 F (39.5 C)  BP (!) 110/58  Pulse Rate (!) 107  Resp 18  SpO2 100 %  O2 Device Room Air  Patient Activity (if Appropriate) In bed  Assess: MEWS Score  MEWS Temp 2  MEWS Systolic 0  MEWS Pulse 1  MEWS RR 0  MEWS LOC 0  MEWS Score 3  MEWS Score Color Yellow  Assess: if the MEWS score is Yellow or Red  Were vital signs taken at a resting state? Yes  Focused Assessment No change from prior assessment  Early Detection of Sepsis Score *See Row Information* Medium  MEWS guidelines implemented *See Row Information* Yes  Treat  MEWS Interventions Administered prn meds/treatments  Pain Scale 0-10  Pain Score 4  Pain Type Acute pain  Pain Location Abdomen  Pain Orientation Left  Pain Descriptors / Indicators Cramping  Pain Frequency Constant  Pain Onset On-going  Patients Stated Pain Goal 0  Pain Intervention(s) Repositioned  Multiple Pain Sites No  Complains of Fever  Interventions Medication (see MAR)  Take Vital Signs  Increase Vital Sign Frequency  Yellow: Q 2hr X 2 then Q 4hr X 2, if remains yellow, continue Q 4hrs  Escalate  MEWS: Escalate Yellow: discuss with charge nurse/RN and consider discussing with provider and RRT  Notify: Charge Nurse/RN  Name of Charge Nurse/RN Notified Davis RN  Date Charge Nurse/RN Notified 06/16/20  Time Charge Nurse/RN Notified 2145

## 2020-06-17 ENCOUNTER — Inpatient Hospital Stay (HOSPITAL_COMMUNITY): Payer: No Typology Code available for payment source

## 2020-06-17 DIAGNOSIS — R7401 Elevation of levels of liver transaminase levels: Secondary | ICD-10-CM

## 2020-06-17 DIAGNOSIS — R1084 Generalized abdominal pain: Secondary | ICD-10-CM

## 2020-06-17 LAB — COMPREHENSIVE METABOLIC PANEL
ALT: 148 U/L — ABNORMAL HIGH (ref 0–44)
AST: 149 U/L — ABNORMAL HIGH (ref 15–41)
Albumin: 2.7 g/dL — ABNORMAL LOW (ref 3.5–5.0)
Alkaline Phosphatase: 261 U/L — ABNORMAL HIGH (ref 38–126)
Anion gap: 7 (ref 5–15)
BUN: 7 mg/dL (ref 6–20)
CO2: 22 mmol/L (ref 22–32)
Calcium: 8.4 mg/dL — ABNORMAL LOW (ref 8.9–10.3)
Chloride: 100 mmol/L (ref 98–111)
Creatinine, Ser: 0.66 mg/dL (ref 0.61–1.24)
GFR, Estimated: >60 mL/min (ref >60)
Glucose, Bld: 100 mg/dL — ABNORMAL HIGH (ref 70–99)
Potassium: 4.1 mmol/L (ref 3.5–5.1)
Sodium: 129 mmol/L — ABNORMAL LOW (ref 135–145)
Total Bilirubin: 3.9 mg/dL — ABNORMAL HIGH (ref 0.3–1.2)
Total Protein: 6.6 g/dL (ref 6.5–8.1)

## 2020-06-17 LAB — CBC
HCT: 37.9 % — ABNORMAL LOW (ref 39.0–52.0)
Hemoglobin: 12.3 g/dL — ABNORMAL LOW (ref 13.0–17.0)
MCH: 20 pg — ABNORMAL LOW (ref 26.0–34.0)
MCHC: 32.5 g/dL (ref 30.0–36.0)
MCV: 61.6 fL — ABNORMAL LOW (ref 80.0–100.0)
Platelets: 176 10*3/uL (ref 150–400)
RBC: 6.15 MIL/uL — ABNORMAL HIGH (ref 4.22–5.81)
RDW: 18.9 % — ABNORMAL HIGH (ref 11.5–15.5)
WBC: 5.1 10*3/uL (ref 4.0–10.5)
nRBC: 0 % (ref 0.0–0.2)

## 2020-06-17 LAB — C DIFFICILE QUICK SCREEN W PCR REFLEX
C Diff antigen: NEGATIVE
C Diff interpretation: NOT DETECTED
C Diff toxin: NEGATIVE

## 2020-06-17 MED ORDER — PROSOURCE PLUS PO LIQD
30.0000 mL | Freq: Two times a day (BID) | ORAL | Status: DC
  Administered 2020-06-17 – 2020-06-19 (×2): 30 mL via ORAL
  Filled 2020-06-17 (×2): qty 30

## 2020-06-17 MED ORDER — BOOST / RESOURCE BREEZE PO LIQD CUSTOM
1.0000 | Freq: Three times a day (TID) | ORAL | Status: DC
  Administered 2020-06-17: 1 via ORAL

## 2020-06-17 MED ORDER — ADULT MULTIVITAMIN W/MINERALS CH
1.0000 | ORAL_TABLET | Freq: Every day | ORAL | Status: DC
  Administered 2020-06-17 – 2020-06-19 (×2): 1 via ORAL
  Filled 2020-06-17 (×2): qty 1

## 2020-06-17 NOTE — Progress Notes (Signed)
Initial Nutrition Assessment  DOCUMENTATION CODES:   Underweight,Severe malnutrition in context of chronic illness  INTERVENTION:   D/c Ensure, pt does not like/tolerate  Boost Breeze po TID, each supplement provides 250 kcal and 9 grams of protein  59m Prosource Plus po BID, each supplement provides 100 kcals and 15 grams of protein  MVI with minerals daily  Provided education regarding improving nutrition status after discharge  NUTRITION DIAGNOSIS:   Severe Malnutrition related to chronic illness (Crohn's disease) as evidenced by severe muscle depletion,severe fat depletion.  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  GI following pt. CT abdomen/pelvis showed mild chronic colitis similar to prior study as well as left perianal fistula. Hepatitis panel negative. GI pathogen panel negative. GI recommending checking for C. Diff and HIDA scan.   Pt feeling very poorly at time of RD visit and unable to provide thorough responses to RD questions. Endorses nausea now but denies vomiting. Pt's mother at bedside assisting with nutrition/weight history. Pt's mother reports that pt eats 3-4 meals per day at baseline and had been doing rather well with meals/appetite for several months up until last Tuesday (4/19) when pt began having trouble tolerating his typical diet and keeping foods down. Pt's mother  states that by Thursday, 4/21, pt was only able to tolerate sips at best.   Pt and mother report that milk/creamy supplements like Ensure (currently ordered BID) are not well tolerated by the pt. Pt/mother agreeable to use of Boost Breeze and Prosource. Discussed various options for improving protein intake/nutrition status once pt discharges.   Pt's diet was advanced to soft today, though pt has not had much of an appetite and no meals have been documented since diet was advanced. Pt only with 1 meal documentation available -- 25% completion of yesterday's full liquid breakfast tray.  No significant weight changes noted per weight readings. Pt's mother endorses stable weight.   No UOP documented Ileostomy: 4019moutput x24 hours I/O: +72270mince admit  Medications: ferrous sulfate, IV abx IVF: NS w/ 29m86mCl @ 100ml49mLabs: Na 129 (L), abnormal LFTs (improving)  NUTRITION - FOCUSED PHYSICAL EXAM:  Flowsheet Row Most Recent Value  Orbital Region No depletion  Upper Arm Region Severe depletion  Thoracic and Lumbar Region Moderate depletion  Buccal Region No depletion  Temple Region No depletion  Clavicle Bone Region Moderate depletion  Clavicle and Acromion Bone Region Moderate depletion  Scapular Bone Region Moderate depletion  Dorsal Hand Mild depletion  Patellar Region Severe depletion  Anterior Thigh Region Severe depletion  Posterior Calf Region Severe depletion  Edema (RD Assessment) None  Hair Reviewed  Eyes Reviewed  Mouth Reviewed  Skin Reviewed  Nails Reviewed       Diet Order:   Diet Order            DIET SOFT Room service appropriate? Yes; Fluid consistency: Thin  Diet effective now  EDUCATION NEEDS:   Education needs have been addressed  Skin:  Skin Assessment: Reviewed RN Assessment  Last BM:  4/24 467m via ileostomy  Height:   Ht Readings from Last 1 Encounters:  06/15/20 6' 4"  (1.93 m)    Weight:   Wt Readings from  Last 1 Encounters:  06/15/20 59 kg   BMI:  Body mass index is 15.83 kg/m.  Estimated Nutritional Needs:   Kcal:  2100-2300  Protein:  105-115 grams  Fluid:  >2L    ALarkin Ina MS, RD, LDN RD pager number and weekend/on-call pager number located in [place]

## 2020-06-17 NOTE — Progress Notes (Signed)
PROGRESS NOTE    Luke Richmond  TOI:[id number] DOB: May 05, 2000 DOA: 06/15/2020 PCP: Karleen Dolphin, MD    Brief Narrative:  20 y.o. male with medical history significant of Crohn's disease gets his care at [facility] started on risankizumab approximately 6 weeks ago status post ileostomy history of fistula reports that his imaging since his surgery in the last year have been somewhat normal at [facility] for 5 days has been having fever nausea vomiting diarrhea.  Denies any sick contacts.  He is having some abdominal pain in the left lower quadrant.  He reports he is not frequently on steroids and has only been to the emergency department once in the last year and only hospitalized once in the last year for his surgery.  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  -LFT's are trending down -Discussed with GI. Recommendation for HIDA scan to r/o biliary disease -CT reviewed with no biliary disease noted on that study  Fever -continues to have intermittent fevers -Cont tylenol as needed. Cont broad spectrum abx as tolerated -Will order blood cultures and CXR -Have ordered HIDA and Cdiff as per above -UA reviewed, neg -On CT no abscess noted, otherwise appears to have chronic changes noted  DVT prophylaxis:  SCD's Code Status: Full Family Communication: Pt in room, family currently at bedside  Status is: Inpatient  Remains inpatient appropriate because:Inpatient level of care appropriate due to severity of illness   Dispo: The patient is from: Home              Anticipated d/c is to: Home              Patient currently is not medically stable to d/c.   Difficult to place patient No  Consultants:   GI  Procedures:     Antimicrobials: Anti-infectives (From admission, onward)   Start     Dose/Rate Route Frequency Ordered Stop   06/15/20 1700  cefTRIAXone (ROCEPHIN) 2 g in sodium chloride 0.9 % 100 mL IVPB        2 g 200 mL/hr over 30 Minutes Intravenous Every 24 hours 06/15/20 1622     06/15/20 1630  metroNIDAZOLE (FLAGYL) IVPB 500 mg        500 mg 100 mL/hr over 60 Minutes Intravenous Every 8 hours 06/15/20 1612     06/15/20 1615  ciprofloxacin (CIPRO) IVPB 400 mg  Status:  Discontinued        400 mg 200 mL/hr over 60 Minutes Intravenous Every 12 hours 06/15/20 1612 06/15/20 1622      Subjective: Feeling weak and tired today  Objective: Vitals:   06/17/20 0112 06/17/20 0527 06/17/20 0908 06/17/20 1400  BP: (!) 101/56 (!) 97/54 118/73 110/67  Pulse: 95 (!) 103 100 (!) 101  Resp: 14 16 16 16   Temp: 98.2  F (36.8 C) 99 F (37.2 C) 99.4 F (37.4 C) (!) 100.4 F (38 C)  TempSrc: Oral Oral Oral Oral  SpO2: 100% 99% 100% 98%  Weight:      Height:        Intake/Output Summary (Last 24 hours) at 06/17/2020 1451 Last data filed at 06/17/2020 0242 Gross per 24 hour  Intake --  Output 400 ml  Net -400 ml   Filed Weights   06/15/20 1804  Weight: 59 kg    Examination: General exam: Conversant, in no acute distress Respiratory system: normal chest rise, clear, no audible wheezing Cardiovascular system: regular rhythm, s1-s2 Gastrointestinal system: Nondistended, nontender, pos BS Central nervous system: No seizures, no tremors Extremities: No cyanosis, no joint  deformities Skin: No rashes, no pallor Psychiatry: Affect normal // no auditory hallucinations   Data Reviewed: I have personally reviewed following labs and imaging studies  CBC: Recent Labs  Lab 06/15/20 1247 06/16/20 0748 06/17/20 0547  WBC 2.3* 4.2 5.1  NEUTROABS 1.7  --   --   HGB 15.5 12.6* 12.3*  HCT 48.7 39.0 37.9*  MCV 62.0* 62.2* 61.6*  PLT 220 136* 734   Basic Metabolic Panel: Recent Labs  Lab 06/15/20 1247 06/16/20 0748 06/17/20 0547  NA 126* 131* 129*  K 4.0 4.3 4.1  CL 91* 98 100  CO2 24 24 22   GLUCOSE 116* 108* 100*  BUN 12 7 7   CREATININE 0.87 0.72 0.66  CALCIUM 8.8* 8.3* 8.4*   GFR: Estimated Creatinine Clearance: 121.9 mL/min (by C-G formula based on SCr of 0.66 mg/dL). Liver Function Tests: Recent Labs  Lab 06/15/20 1247 06/16/20 0748 06/17/20 0547  AST 241* 204* 149*  ALT 169* 168* 148*  ALKPHOS 193* 174* 261*  BILITOT 6.5* 4.1* 3.9*  PROT 8.3* 6.5 6.6  ALBUMIN 3.5 2.7* 2.7*   Recent Labs  Lab 06/15/20 1247  LIPASE 21   No results for input(s): AMMONIA in the last 168 hours. Coagulation Profile: No results for input(s): INR, PROTIME in the last 168 hours. Cardiac Enzymes: No results for input(s): CKTOTAL, CKMB, CKMBINDEX, TROPONINI in the last 168 hours. BNP (last 3 results) No results for input(s): PROBNP in the last 8760 hours. HbA1C: No results for input(s): HGBA1C in the last 72 hours. CBG: No results for input(s): GLUCAP in the last 168 hours. Lipid Profile: No results for input(s): CHOL, HDL, LDLCALC, TRIG, CHOLHDL, LDLDIRECT in the last 72 hours. Thyroid Function Tests: No results for input(s): TSH, T4TOTAL, FREET4, T3FREE, THYROIDAB in the last 72 hours. Anemia Panel: No results for input(s): VITAMINB12, FOLATE, FERRITIN, TIBC, IRON, RETICCTPCT in the last 72 hours. Sepsis Labs: No results for input(s): PROCALCITON, LATICACIDVEN in the last 168 hours.  Recent Results (from the past 240 hour(s))  Resp Panel by  RT-PCR (Flu A&B, Covid) Nasopharyngeal Swab     Status: None   Collection Time: 06/15/20 12:47 PM   Specimen: Nasopharyngeal Swab; Nasopharyngeal(NP) swabs in vial transport medium  Result Value Ref Range Status   SARS Coronavirus 2 by RT PCR NEGATIVE NEGATIVE Final    Comment: (NOTE) SARS-CoV-2 target nucleic acids are NOT DETECTED.  The SARS-CoV-2 RNA is generally detectable in upper respiratory specimens during the acute phase of infection. The lowest concentration of SARS-CoV-2 viral copies this assay can detect is 138 copies/mL. A negative result does not preclude SARS-Cov-2 infection and should not be used as the sole basis for treatment or other patient management decisions. A negative result may occur with  improper specimen collection/handling, submission of specimen other than nasopharyngeal swab, presence of viral mutation(s) within the areas targeted by this assay, and inadequate number of viral copies(<138 copies/mL). A negative result must be combined with clinical observations, patient history, and epidemiological information. The expected result is Negative.  Fact Sheet for Patients:  EntrepreneurPulse.com.au  Fact Sheet for Healthcare Providers:  IncredibleEmployment.be  This test is no t yet approved or cleared by the [place] FDA and  has been authorized for detection and/or diagnosis of SARS-CoV-2 by FDA under an Emergency Use Authorization (EUA). This EUA will remain  in effect (meaning this test can be used) for the duration of the COVID-19 declaration under Section 564(b)(1) of the Act, 21 U.S.C.section 360bbb-3(b)(1), unless the authorization is terminated  or revoked sooner.       Influenza A by PCR NEGATIVE NEGATIVE Final   Influenza B by PCR NEGATIVE NEGATIVE Final    Comment: (NOTE) The Xpert Xpress SARS-CoV-2/FLU/RSV plus assay is intended as an aid in the diagnosis of influenza from Nasopharyngeal swab  specimens and should not be used as a sole basis for treatment. Nasal washings and aspirates are unacceptable for Xpert Xpress SARS-CoV-2/FLU/RSV testing.  Fact Sheet for Patients: EntrepreneurPulse.com.au  Fact Sheet for Healthcare Providers: IncredibleEmployment.be  This test is not yet approved or cleared by the [place] FDA and has been authorized for detection and/or diagnosis of SARS-CoV-2 by FDA under an Emergency Use Authorization (EUA). This EUA will remain in effect (meaning this test can be used) for the duration of the COVID-19 declaration under Section 564(b)(1) of the Act, 21 U.S.C. section 360bbb-3(b)(1), unless the authorization is terminated or revoked.  Performed at [facility], [place] [address], [place], [place] [address]   Gastrointestinal Panel by PCR , Stool     Status: None   Collection Time: 06/15/20  3:24 PM   Specimen: Ileostomy; Stool  Result Value Ref Range Status   Campylobacter species NOT DETECTED NOT DETECTED Final   Plesimonas shigelloides NOT DETECTED NOT DETECTED Final   Salmonella species NOT DETECTED NOT DETECTED Final   Yersinia enterocolitica NOT DETECTED NOT DETECTED Final   Vibrio species NOT DETECTED NOT DETECTED Final   Vibrio cholerae NOT DETECTED NOT DETECTED Final   Enteroaggregative E coli (EAEC) NOT DETECTED NOT DETECTED Final   Enteropathogenic E coli (EPEC) NOT DETECTED NOT DETECTED Final   Enterotoxigenic E coli (ETEC) NOT DETECTED NOT DETECTED Final   Shiga like toxin producing E coli (STEC) NOT DETECTED NOT DETECTED Final   Shigella/Enteroinvasive E coli (EIEC) NOT DETECTED NOT DETECTED Final   Cryptosporidium NOT DETECTED NOT DETECTED Final   Cyclospora cayetanensis NOT DETECTED NOT DETECTED Final   Entamoeba histolytica NOT DETECTED NOT DETECTED Final   Giardia lamblia NOT DETECTED NOT DETECTED Final   Adenovirus F40/41 NOT DETECTED NOT DETECTED Final   Astrovirus  NOT DETECTED NOT DETECTED Final   Norovirus GI/GII NOT DETECTED NOT DETECTED Final   Rotavirus A NOT DETECTED NOT DETECTED Final   Sapovirus (I, II, IV, and V) NOT DETECTED NOT DETECTED Final    Comment: Performed at [facility], [address] [place], [place] [address]  Culture, blood (routine x 2)     Status: None (Preliminary result)   Collection Time: 06/17/20 10:57 AM   Specimen: BLOOD RIGHT ARM  Result Value Ref Range Status   Specimen Description   Final    BLOOD RIGHT ARM Performed at [facility], [address]  [place], [place] [address]    Special Requests   Final    BOTTLES DRAWN AEROBIC ONLY Blood Culture results may not be optimal due to an inadequate volume of blood received in culture bottles Performed at [facility] [address] [place], [place] [address]    Culture PENDING  Incomplete   Report Status PENDING  Incomplete     Radiology Studies: DG CHEST PORT 1 VIEW  Result Date: 06/17/2020 CLINICAL DATA:  Abdominal pain and fever. History of Crohn's disease and sickle cell disease. EXAM: PORTABLE CHEST 1 VIEW COMPARISON:  None. FINDINGS: Lungs are clear. Heart size and pulmonary vascularity are normal. No adenopathy. No evident bone lesions. IMPRESSION: Lungs clear.  Cardiac silhouette normal. Electronically Signed   By: Lowella Grip III M.D.   On: 06/17/2020 11:50    Scheduled Meds: . (feeding supplement) PROSource Plus  30 mL Oral BID BM  . feeding supplement  1 Container Oral TID BM  . ferrous sulfate  325 mg Oral Q breakfast  . multivitamin with minerals  1 tablet Oral Daily   Continuous Infusions: . 0.9 % NaCl with KCl 20 mEq / L 100 mL/hr at 06/17/20 1153  . cefTRIAXone (ROCEPHIN)  IV 2 g (06/16/20 1721)  . metronidazole 500 mg (06/17/20 1019)     LOS: 2 days   Marylu Lund, MD [facility] Pager On Amion  If 7PM-7AM, please contact night-coverage 06/17/2020, 2:51 PM

## 2020-06-17 NOTE — Progress Notes (Signed)
[facility] Gastroenterology Progress Note  Luke Richmond 20 y.o. [birth year]  CC:   Complicated Crohn's disease, fever   Subjective: Patient seen and examined at bedside.  Continues to spike fever.  Complaining of left-sided discomfort.  Denies any right upper quadrant abdominal pain.  Complaining of nausea but denies any vomiting.  ROS : Low-grade fever, negative for chest pain.   Objective: Vital signs in last 24 hours: Vitals:   06/17/20 0527 06/17/20 0908  BP: (!) 97/54 118/73  Pulse: (!) 103 100  Resp: 16 16  Temp: 99 F (37.2 C) 99.4 F (37.4 C)  SpO2: 99% 100%    Physical Exam:  General:  Alert, cooperative, no distress, appears stated age  Head:  Normocephalic, without obvious abnormality, atraumatic  Eyes:  , EOM's intact,   Lungs:   Clear to auscultation bilaterally, respirations unlabored  Heart:  Regular rate and rhythm, S1, S2 normal  Abdomen:   Soft, non-tender, nondistended, bowel sounds present.  Ileostomy noted  Extremities: Extremities normal, atraumatic, no  edema  Pulses: 2+ and symmetric    Lab Results: Recent Labs    06/16/20 0748 06/17/20 0547  NA 131* 129*  K 4.3 4.1  CL 98 100  CO2 24 22  GLUCOSE 108* 100*  BUN 7 7  CREATININE 0.72 0.66  CALCIUM 8.3* 8.4*   Recent Labs    06/16/20 0748 06/17/20 0547  AST 204* 149*  ALT 168* 148*  ALKPHOS 174* 261*  BILITOT 4.1* 3.9*  PROT 6.5 6.6  ALBUMIN 2.7* 2.7*   Recent Labs    06/15/20 1247 06/16/20 0748 06/17/20 0547  WBC 2.3* 4.2 5.1  NEUTROABS 1.7  --   --   HGB 15.5 12.6* 12.3*  HCT 48.7 39.0 37.9*  MCV 62.0* 62.2* 61.6*  PLT 220 136* 176   No results for input(s): LABPROT, INR in the last 72 hours.    Assessment/Plan: -Nausea, vomiting, fever in a patient with complicated Crohn's disease.  CT abdomen pelvis showed mild chronic colitis similar to prior study as well as left perianal fistula. - complicated and refractory Crohn's disease, status post diverting loop ileostomy in  2021, with anal ulceration and perianal fistula, initially diagnosed in 2015, failed mesalamine, azathioprine, methotrexate, Remicade, Humira, Entyvio,Cimzia and Stelara.  Was started on Skyrizi in February 2022 with last infusion on May 14, 2020. -Abnormal LFTs.  Improving.  Hepatitis panel negative -Sickle cell trait  Recommendations ------------------------- -GI pathogen panel negative.  -Because of ongoing fever, recommend checking for C. difficile as well as HIDA scan  -Okay to advance diet -Continue antibiotics -GI will follow -Discussed with hospitalist at bedside.   Otis Brace MD, [place] 06/17/2020, 10:52 AM  Contact #  [phone]

## 2020-06-18 ENCOUNTER — Inpatient Hospital Stay (HOSPITAL_COMMUNITY): Payer: No Typology Code available for payment source

## 2020-06-18 DIAGNOSIS — E43 Unspecified severe protein-calorie malnutrition: Secondary | ICD-10-CM | POA: Insufficient documentation

## 2020-06-18 LAB — COMPREHENSIVE METABOLIC PANEL
ALT: 166 U/L — ABNORMAL HIGH (ref 0–44)
AST: 163 U/L — ABNORMAL HIGH (ref 15–41)
Albumin: 3.1 g/dL — ABNORMAL LOW (ref 3.5–5.0)
Alkaline Phosphatase: 411 U/L — ABNORMAL HIGH (ref 38–126)
Anion gap: 8 (ref 5–15)
BUN: 6 mg/dL (ref 6–20)
CO2: 26 mmol/L (ref 22–32)
Calcium: 8.6 mg/dL — ABNORMAL LOW (ref 8.9–10.3)
Chloride: 97 mmol/L — ABNORMAL LOW (ref 98–111)
Creatinine, Ser: 0.72 mg/dL (ref 0.61–1.24)
GFR, Estimated: >60 mL/min (ref >60)
Glucose, Bld: 107 mg/dL — ABNORMAL HIGH (ref 70–99)
Potassium: 3.8 mmol/L (ref 3.5–5.1)
Sodium: 131 mmol/L — ABNORMAL LOW (ref 135–145)
Total Bilirubin: 3.1 mg/dL — ABNORMAL HIGH (ref 0.3–1.2)
Total Protein: 7.7 g/dL (ref 6.5–8.1)

## 2020-06-18 LAB — CBC
HCT: 41.3 % (ref 39.0–52.0)
Hemoglobin: 13.3 g/dL (ref 13.0–17.0)
MCH: 19.8 pg — ABNORMAL LOW (ref 26.0–34.0)
MCHC: 32.2 g/dL (ref 30.0–36.0)
MCV: 61.5 fL — ABNORMAL LOW (ref 80.0–100.0)
Platelets: 204 10*3/uL (ref 150–400)
RBC: 6.71 MIL/uL — ABNORMAL HIGH (ref 4.22–5.81)
RDW: 19.3 % — ABNORMAL HIGH (ref 11.5–15.5)
WBC: 4.3 10*3/uL (ref 4.0–10.5)
nRBC: 0 % (ref 0.0–0.2)

## 2020-06-18 MED ORDER — TECHNETIUM TC 99M MEBROFENIN IV KIT
7.3000 | PACK | Freq: Once | INTRAVENOUS | Status: AC | PRN
  Administered 2020-06-18: 7.3 via INTRAVENOUS

## 2020-06-18 MED ORDER — IBUPROFEN 200 MG PO TABS: 400.0000 mg | ORAL_TABLET | ORAL | Status: DC | PRN

## 2020-06-18 NOTE — Progress Notes (Signed)
PROGRESS NOTE    Luke Richmond  EYC:[id number] DOB: 03/05/2000 DOA: 06/15/2020 PCP: Karleen Dolphin, MD    Brief Narrative:  20 y.o. male with medical history significant of Crohn's disease gets his care at [facility] started on risankizumab approximately 6 weeks ago status post ileostomy history of fistula reports that his imaging since his surgery in the last year have been somewhat normal at [facility] for 5 days has been having fever nausea vomiting diarrhea.  Denies any sick contacts.  He is having some abdominal pain in the left lower quadrant.  He reports he is not frequently on steroids and has only been to the emergency department once in the last year and only hospitalized once in the last year for his surgery.  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 -Continue broad spectrum abx per below, cultures are neg -Discussed with GI who recommended checking for Cdiff    Sickle cell trait (HCC)-noted, seems stable at this time    Elevated LFTs -unclear if this is associated with his recent medication started for his Crohn's.  -LFT's are trending down -CT reviewed with no biliary disease noted on that study -Discussed with GI today, plan for HIDA scan  Fever -continues to have intermittent fevers -Cont tylenol as needed. Cont broad spectrum abx as tolerated -CXR reviewed, clear -Have ordered HIDA per above, pending -CDiff is neg -UA reviewed, neg -On CT no abscess noted, otherwise appears to have chronic changes noted  DVT prophylaxis:  SCD's Code Status: Full Family Communication: Pt in room, family currently at bedside  Status is: Inpatient  Remains inpatient appropriate because:Inpatient level of care appropriate due to severity of illness   Dispo: The patient is from: Home              Anticipated d/c is to: Home              Patient currently is not medically stable to d/c.   Difficult to place patient No  Consultants:   GI  Procedures:     Antimicrobials: Anti-infectives (From admission, onward)   Start     Dose/Rate Route Frequency Ordered Stop   06/15/20 1700  cefTRIAXone (ROCEPHIN) 2 g in sodium chloride 0.9 % 100 mL IVPB        2 g 200 mL/hr over 30 Minutes Intravenous Every 24 hours 06/15/20 1622     06/15/20 1630  metroNIDAZOLE (FLAGYL) IVPB 500 mg        500 mg 100 mL/hr over 60 Minutes Intravenous Every 8 hours 06/15/20 1612     06/15/20 1615  ciprofloxacin (CIPRO) IVPB 400 mg  Status:  Discontinued        400 mg 200 mL/hr over 60 Minutes Intravenous Every 12 hours 06/15/20 1612 06/15/20 1622      Subjective: Fevers again overnight with chills. Eager to have HIDA. Has been kept NPO for HIDA and wants to eat  Objective: Vitals:   06/18/20 0633 06/18/20 0719 06/18/20 0832 06/18/20 1032  BP: 114/62  111/61 111/61  Pulse: (!) 108  96 (!) 105  Resp: 18  14 14   Temp: (!) 101.9 F (38.8 C) (!) 101.1 F (38.4 C) (!) 101.1 F (38.4 C) (!) 101.5 F (38.6 C)  TempSrc: Oral  Oral Oral  SpO2: 99%  100% 96%  Weight:      Height:        Intake/Output Summary (Last 24 hours) at 06/18/2020 1334 Last data filed at 06/18/2020 6967 Gross per 24 hour  Intake 0 ml  Output 100 ml  Net -100 ml   Filed Weights   06/15/20 1804  Weight: 59 kg    Examination: General exam: Awake, laying in bed, in nad Respiratory system: Normal respiratory effort, no wheezing Cardiovascular system: regular rate, s1, s2 Gastrointestinal system: Soft, nondistended, positive BS Central nervous system: CN2-12  grossly intact, strength intact Extremities: Perfused, no clubbing Skin: Normal skin turgor, no notable skin lesions seen Psychiatry: Mood normal // no visual hallucinations   Data Reviewed: I have personally reviewed following labs and imaging studies  CBC: Recent Labs  Lab 06/15/20 1247 06/16/20 0748 06/17/20 0547 06/18/20 0522  WBC 2.3* 4.2 5.1 4.3  NEUTROABS 1.7  --   --   --   HGB 15.5 12.6* 12.3* 13.3  HCT 48.7 39.0 37.9* 41.3  MCV 62.0* 62.2* 61.6* 61.5*  PLT 220 136* 176 893   Basic Metabolic Panel: Recent Labs  Lab 06/15/20 1247 06/16/20 0748 06/17/20 0547 06/18/20 0522  NA 126* 131* 129* 131*  K 4.0 4.3 4.1 3.8  CL 91* 98 100 97*  CO2 24 24 22 26   GLUCOSE 116* 108* 100* 107*  BUN 12 7 7 6   CREATININE 0.87 0.72 0.66 0.72  CALCIUM 8.8* 8.3* 8.4* 8.6*   GFR: Estimated Creatinine Clearance: 121.9 mL/min (by C-G formula based on SCr of 0.72 mg/dL). Liver Function Tests: Recent Labs  Lab 06/15/20 1247 06/16/20 0748 06/17/20 0547 06/18/20 0522  AST 241* 204* 149* 163*  ALT 169* 168* 148* 166*  ALKPHOS 193* 174* 261* 411*  BILITOT 6.5* 4.1* 3.9* 3.1*  PROT 8.3* 6.5 6.6 7.7  ALBUMIN 3.5 2.7* 2.7* 3.1*   Recent Labs  Lab 06/15/20 1247  LIPASE 21   No results for input(s): AMMONIA in the last 168 hours. Coagulation Profile: No results for input(s): INR, PROTIME in the last 168 hours. Cardiac Enzymes: No results for input(s): CKTOTAL, CKMB, CKMBINDEX, TROPONINI in the last 168 hours. BNP (last 3 results) No results for input(s): PROBNP in the last 8760 hours. HbA1C: No results for input(s): HGBA1C in the last 72 hours. CBG: No results for input(s): GLUCAP in the last 168 hours. Lipid Profile: No results for input(s): CHOL, HDL, LDLCALC, TRIG, CHOLHDL, LDLDIRECT in the last 72 hours. Thyroid Function Tests: No results for input(s): TSH, T4TOTAL, FREET4, T3FREE, THYROIDAB in the last 72 hours. Anemia Panel: No results for input(s): VITAMINB12,  FOLATE, FERRITIN, TIBC, IRON, RETICCTPCT in the last 72 hours. Sepsis Labs: No results for input(s): PROCALCITON, LATICACIDVEN in the last 168 hours.  Recent Results (from the past 240 hour(s))  Resp Panel by RT-PCR (Flu A&B, Covid) Nasopharyngeal Swab     Status: None   Collection Time: 06/15/20 12:47 PM   Specimen: Nasopharyngeal Swab; Nasopharyngeal(NP) swabs in vial transport medium  Result Value Ref Range Status   SARS Coronavirus 2 by RT PCR NEGATIVE NEGATIVE Final    Comment: (NOTE) SARS-CoV-2 target nucleic acids are NOT DETECTED.  The SARS-CoV-2 RNA is generally detectable in upper respiratory specimens during the acute phase  of infection. The lowest concentration of SARS-CoV-2 viral copies this assay can detect is 138 copies/mL. A negative result does not preclude SARS-Cov-2 infection and should not be used as the sole basis for treatment or other patient management decisions. A negative result may occur with  improper specimen collection/handling, submission of specimen other than nasopharyngeal swab, presence of viral mutation(s) within the areas targeted by this assay, and inadequate number of viral copies(<138 copies/mL). A negative result must be combined with clinical observations, patient history, and epidemiological information. The expected result is Negative.  Fact Sheet for Patients:  EntrepreneurPulse.com.au  Fact Sheet for Healthcare Providers:  IncredibleEmployment.be  This test is no t yet approved or cleared by the [place] FDA and  has been authorized for detection and/or diagnosis of SARS-CoV-2 by FDA under an Emergency Use Authorization (EUA). This EUA will remain  in effect (meaning this test can be used) for the duration of the COVID-19 declaration under Section 564(b)(1) of the Act, 21 U.S.C.section 360bbb-3(b)(1), unless the authorization is terminated  or revoked sooner.       Influenza A by PCR  NEGATIVE NEGATIVE Final   Influenza B by PCR NEGATIVE NEGATIVE Final    Comment: (NOTE) The Xpert Xpress SARS-CoV-2/FLU/RSV plus assay is intended as an aid in the diagnosis of influenza from Nasopharyngeal swab specimens and should not be used as a sole basis for treatment. Nasal washings and aspirates are unacceptable for Xpert Xpress SARS-CoV-2/FLU/RSV testing.  Fact Sheet for Patients: EntrepreneurPulse.com.au  Fact Sheet for Healthcare Providers: IncredibleEmployment.be  This test is not yet approved or cleared by the [place] FDA and has been authorized for detection and/or diagnosis of SARS-CoV-2 by FDA under an Emergency Use Authorization (EUA). This EUA will remain in effect (meaning this test can be used) for the duration of the COVID-19 declaration under Section 564(b)(1) of the Act, 21 U.S.C. section 360bbb-3(b)(1), unless the authorization is terminated or revoked.  Performed at [facility], [place] [address] [place], [place] [address]   Gastrointestinal Panel by PCR , Stool     Status: None   Collection Time: 06/15/20  3:24 PM   Specimen: Ileostomy; Stool  Result Value Ref Range Status   Campylobacter species NOT DETECTED NOT DETECTED Final   Plesimonas shigelloides NOT DETECTED NOT DETECTED Final   Salmonella species NOT DETECTED NOT DETECTED Final   Yersinia enterocolitica NOT DETECTED NOT DETECTED Final   Vibrio species NOT DETECTED NOT DETECTED Final   Vibrio cholerae NOT DETECTED NOT DETECTED Final   Enteroaggregative E coli (EAEC) NOT DETECTED NOT DETECTED Final   Enteropathogenic E coli (EPEC) NOT DETECTED NOT DETECTED Final   Enterotoxigenic E coli (ETEC) NOT DETECTED NOT DETECTED Final   Shiga like toxin producing E coli (STEC) NOT DETECTED NOT DETECTED Final   Shigella/Enteroinvasive E coli (EIEC) NOT DETECTED NOT DETECTED Final   Cryptosporidium NOT DETECTED NOT DETECTED Final   Cyclospora  cayetanensis NOT DETECTED NOT DETECTED Final   Entamoeba histolytica NOT DETECTED NOT DETECTED Final   Giardia lamblia NOT DETECTED NOT DETECTED Final   Adenovirus F40/41 NOT DETECTED NOT DETECTED Final   Astrovirus NOT DETECTED NOT DETECTED Final   Norovirus GI/GII NOT DETECTED NOT DETECTED Final   Rotavirus A NOT DETECTED NOT DETECTED Final   Sapovirus (I, II, IV, and V) NOT DETECTED NOT DETECTED Final    Comment: Performed at [facility], [address] [place], [place] [address]  Culture, blood (routine x 2)  Status: None (Preliminary result)   Collection Time: 06/17/20 10:57 AM   Specimen: BLOOD RIGHT ARM  Result Value Ref Range Status   Specimen Description   Final    BLOOD RIGHT ARM Performed at [facility], [address] [place], [place] [address]    Special Requests   Final    BOTTLES DRAWN AEROBIC ONLY Blood Culture results may not be optimal due to an inadequate volume of blood received in culture bottles Performed at [facility] [address], [place], [place] [address]    Culture   Final    NO GROWTH < 24 HOURS Performed at [facility] [address], [place], [place] [address]    Report Status PENDING  Incomplete  Culture, blood (routine x 2)     Status: None (Preliminary result)   Collection Time: 06/17/20 10:57 AM   Specimen: BLOOD RIGHT HAND  Result Value Ref Range Status   Specimen Description   Final    BLOOD RIGHT HAND Performed at [facility] [address] [place], [place] [address]    Special Requests   Final    BOTTLES DRAWN AEROBIC AND ANAEROBIC Blood Culture adequate volume Performed at [facility] [address], [place], [place] [address]    Culture   Final    NO GROWTH < 24 HOURS Performed at [facility] [address] [place], [place] [address]    Report Status PENDING  Incomplete  C Difficile Quick Screen w PCR reflex     Status: None    Collection Time: 06/17/20  2:48 PM   Specimen: STOOL  Result Value Ref Range Status   C Diff antigen NEGATIVE NEGATIVE Final   C Diff toxin NEGATIVE NEGATIVE Final   C Diff interpretation No C. difficile detected.  Final    Comment: Performed at [facility], [place] [address], [place],  [address]     Radiology Studies: DG CHEST PORT 1 VIEW  Result Date: 06/17/2020 CLINICAL DATA:  Abdominal pain and fever. History of Crohn's disease and sickle cell disease. EXAM: PORTABLE CHEST 1 VIEW COMPARISON:  None. FINDINGS: Lungs are clear. Heart size and pulmonary vascularity are normal. No adenopathy. No evident bone lesions. IMPRESSION: Lungs clear.  Cardiac silhouette normal. Electronically Signed   By: Lowella Grip III M.D.   On: 06/17/2020 11:50    Scheduled Meds: . (feeding supplement) PROSource Plus  30 mL Oral BID BM  . feeding supplement  1 Container Oral TID BM  . ferrous sulfate  325 mg Oral Q breakfast  . multivitamin with minerals  1 tablet Oral Daily   Continuous Infusions: . 0.9 % NaCl with KCl 20 mEq / L 100 mL/hr at 06/17/20 1153  . cefTRIAXone (ROCEPHIN)  IV 2 g (06/17/20 1847)  . metronidazole 500 mg (06/18/20 1120)     LOS: 3 days   Marylu Lund, MD [facility] Pager On Amion  If 7PM-7AM, please contact night-coverage 06/18/2020, 1:34 PM

## 2020-06-18 NOTE — Progress Notes (Signed)
   06/18/20 8891  Assess: MEWS Score  Temp (!) 101.9 F (38.8 C)  BP 114/62  Pulse Rate (!) 108  Resp 18  SpO2 99 %  O2 Device Room Air  Assess: MEWS Score  MEWS Temp 2  MEWS Systolic 0  MEWS Pulse 1  MEWS RR 0  MEWS LOC 0  MEWS Score 3  MEWS Score Color Yellow  Assess: if the MEWS score is Yellow or Red  Were vital signs taken at a resting state? Yes  Focused Assessment No change from prior assessment  MEWS guidelines implemented *See Row Information* No, vital signs rechecked  Treat  MEWS Interventions Administered prn meds/treatments  Take Vital Signs  Increase Vital Sign Frequency  Yellow: Q 2hr X 2 then Q 4hr X 2, if remains yellow, continue Q 4hrs  Escalate  MEWS: Escalate Yellow: discuss with charge nurse/RN and consider discussing with provider and RRT  Notify: Charge Nurse/RN  Name of Charge Nurse/RN Notified Detric Scalisi's pt, attending notified  Date Charge Nurse/RN Notified 06/18/20  Time Charge Nurse/RN Notified 6945  Notify: Provider  Provider Name/Title attending G  Date Provider Notified 06/18/20  Time Provider Notified [phone]  Notification Type Page  Notification Reason Change in status  Document  Patient Outcome Stabilized after interventions

## 2020-06-18 NOTE — Progress Notes (Signed)
[facility] Gastroenterology Progress Note  Luke Richmond 20 y.o. [birth year]  CC:   Complicated Crohn's disease, fever   Subjective: Patient seen and examined at bedside.  Continues to spike fever.  Continues to have intermittent abdominal pain. diarrhea has improved  ROS : Positive for fever, negative for chest pain.   Objective: Vital signs in last 24 hours: Vitals:   06/18/20 0719 06/18/20 0832  BP:  111/61  Pulse:  96  Resp:  14  Temp: (!) 101.1 F (38.4 C) (!) 101.1 F (38.4 C)  SpO2:  100%    Physical Exam:  General:  Alert, cooperative, no distress, appears stated age  Head:  Normocephalic, without obvious abnormality, atraumatic  Eyes:  , EOM's intact,   Lungs:   Clear to auscultation bilaterally, respirations unlabored  Heart:  Regular rate and rhythm, S1, S2 normal  Abdomen:   Soft, non-tender, nondistended, bowel sounds present.  Ileostomy noted  Extremities: Extremities normal, atraumatic, no  edema  Pulses: 2+ and symmetric    Lab Results: Recent Labs    06/17/20 0547 06/18/20 0522  NA 129* 131*  K 4.1 3.8  CL 100 97*  CO2 22 26  GLUCOSE 100* 107*  BUN 7 6  CREATININE 0.66 0.72  CALCIUM 8.4* 8.6*   Recent Labs    06/17/20 0547 06/18/20 0522  AST 149* 163*  ALT 148* 166*  ALKPHOS 261* 411*  BILITOT 3.9* 3.1*  PROT 6.6 7.7  ALBUMIN 2.7* 3.1*   Recent Labs    06/15/20 1247 06/16/20 0748 06/17/20 0547 06/18/20 0522  WBC 2.3*   < > 5.1 4.3  NEUTROABS 1.7  --   --   --   HGB 15.5   < > 12.3* 13.3  HCT 48.7   < > 37.9* 41.3  MCV 62.0*   < > 61.6* 61.5*  PLT 220   < > 176 204   < > = values in this interval not displayed.   No results for input(s): LABPROT, INR in the last 72 hours.    Assessment/Plan: -Nausea, vomiting, fever in a patient with complicated Crohn's disease.  CT abdomen pelvis showed mild chronic colitis similar to prior study as well as left perianal fistula. - complicated and refractory Crohn's disease, status post  diverting loop ileostomy in 2021, with anal ulceration and perianal fistula, initially diagnosed in 2015, failed mesalamine, azathioprine, methotrexate, Remicade, Humira, Entyvio,Cimzia and Stelara.  Was started on Skyrizi in February 2022 with last infusion on May 14, 2020. -Abnormal LFTs.  Improving.  Hepatitis panel negative -Sickle cell trait  Recommendations ------------------------- -GI pathogen panel negative.  C. difficile negative. -Follow HIDA scan  -Okay to advance diet after HIDA scan  -Continue antibiotics -GI will follow    Otis Brace MD, [place] 06/18/2020, 9:55 AM  Contact #  [phone]

## 2020-06-19 DIAGNOSIS — E43 Unspecified severe protein-calorie malnutrition: Secondary | ICD-10-CM

## 2020-06-19 LAB — CBC
HCT: 33.9 % — ABNORMAL LOW (ref 39.0–52.0)
Hemoglobin: 10.9 g/dL — ABNORMAL LOW (ref 13.0–17.0)
MCH: 19.8 pg — ABNORMAL LOW (ref 26.0–34.0)
MCHC: 32.2 g/dL (ref 30.0–36.0)
MCV: 61.6 fL — ABNORMAL LOW (ref 80.0–100.0)
Platelets: 257 10*3/uL (ref 150–400)
RBC: 5.5 MIL/uL (ref 4.22–5.81)
RDW: 18.1 % — ABNORMAL HIGH (ref 11.5–15.5)
WBC: 4.6 10*3/uL (ref 4.0–10.5)
nRBC: 0 % (ref 0.0–0.2)

## 2020-06-19 LAB — COMPREHENSIVE METABOLIC PANEL
ALT: 116 U/L — ABNORMAL HIGH (ref 0–44)
AST: 96 U/L — ABNORMAL HIGH (ref 15–41)
Albumin: 2.5 g/dL — ABNORMAL LOW (ref 3.5–5.0)
Alkaline Phosphatase: 370 U/L — ABNORMAL HIGH (ref 38–126)
Anion gap: 7 (ref 5–15)
BUN: 5 mg/dL — ABNORMAL LOW (ref 6–20)
CO2: 22 mmol/L (ref 22–32)
Calcium: 8.1 mg/dL — ABNORMAL LOW (ref 8.9–10.3)
Chloride: 102 mmol/L (ref 98–111)
Creatinine, Ser: 0.54 mg/dL — ABNORMAL LOW (ref 0.61–1.24)
GFR, Estimated: >60 mL/min (ref >60)
Glucose, Bld: 102 mg/dL — ABNORMAL HIGH (ref 70–99)
Potassium: 4.1 mmol/L (ref 3.5–5.1)
Sodium: 131 mmol/L — ABNORMAL LOW (ref 135–145)
Total Bilirubin: 1.7 mg/dL — ABNORMAL HIGH (ref 0.3–1.2)
Total Protein: 6.2 g/dL — ABNORMAL LOW (ref 6.5–8.1)

## 2020-06-19 NOTE — Plan of Care (Signed)
  Problem: Activity: Goal: Risk for activity intolerance will decrease Outcome: Progressing   Problem: Nutrition: Goal: Adequate nutrition will be maintained Outcome: Progressing   Problem: Pain Managment: Goal: General experience of comfort will improve Outcome: Progressing   

## 2020-06-19 NOTE — Progress Notes (Signed)
[facility] Gastroenterology Progress Note  Luke Richmond 20 y.o. [birth year]  CC:   Complicated Crohn's disease, fever   Subjective: Patient seen and examined at bedside.  Sitting in a chair.  Feeling much better.  Tolerating diet.  Diarrhea has resolved.  Afebrile since last night.  ROS : Afebrile, negative for chest pain.   Objective: Vital signs in last 24 hours: Vitals:   06/18/20 2213 06/19/20 0454  BP: 114/72 112/65  Pulse: 86 93  Resp: 17 16  Temp: 98.2 F (36.8 C) 98.8 F (37.1 C)  SpO2: 100% 99%    Physical Exam:  General:  Alert, cooperative, no distress, appears stated age  Head:  Normocephalic, without obvious abnormality, atraumatic  Eyes:  , EOM's intact,   Lungs:   Clear to auscultation bilaterally, respirations unlabored  Heart:  Regular rate and rhythm, S1, S2 normal  Abdomen:   Soft, non-tender, nondistended, bowel sounds present.  Ileostomy noted  Extremities: Extremities normal, atraumatic, no  edema  Pulses: 2+ and symmetric    Lab Results: Recent Labs    06/18/20 0522 06/19/20 0453  NA 131* 131*  K 3.8 4.1  CL 97* 102  CO2 26 22  GLUCOSE 107* 102*  BUN 6 5*  CREATININE 0.72 0.54*  CALCIUM 8.6* 8.1*   Recent Labs    06/18/20 0522 06/19/20 0453  AST 163* 96*  ALT 166* 116*  ALKPHOS 411* 370*  BILITOT 3.1* 1.7*  PROT 7.7 6.2*  ALBUMIN 3.1* 2.5*   Recent Labs    06/18/20 0522 06/19/20 0453  WBC 4.3 4.6  HGB 13.3 10.9*  HCT 41.3 33.9*  MCV 61.5* 61.6*  PLT 204 257   No results for input(s): LABPROT, INR in the last 72 hours.    Assessment/Plan: -Nausea, vomiting, fever in a patient with complicated Crohn's disease.  CT abdomen pelvis showed mild chronic colitis similar to prior study as well as left perianal fistula.-Resolved - complicated and refractory Crohn's disease, status post diverting loop ileostomy in 2021, with anal ulceration and perianal fistula, initially diagnosed in 2015, failed mesalamine, azathioprine,  methotrexate, Remicade, Humira, Entyvio,Cimzia and Stelara.  Was started on Skyrizi in February 2022 with last infusion on May 14, 2020. -Abnormal LFTs.  Improving.  Hepatitis panel negative -Sickle cell trait  Recommendations ------------------------- -GI pathogen panel negative.  C. difficile negative. -Normal HIDA scan  -No further inpatient GI work-up planned.  Okay to discharge from GI standpoint. -He will finish total 7 days of antibiotics. -Follow-up with his primary GI at [facility] will sign off.  Call us back if needed    Otis Brace MD, [place] 06/19/2020, 1:14 PM  Contact #  [phone]

## 2020-06-19 NOTE — Progress Notes (Signed)
[facility] PROGRESS NOTE    Progress Note  Luke Richmond  QJJ:[id number] DOB: 11/10/2000 DOA: 06/15/2020 PCP: Karleen Dolphin, MD     Brief Narrative:   Luke Richmond is an 20 y.o. male past medical history significant for Crohn's disease (caregiver reports was started recently on  risankizumab approximately 6 weeks ago status post ileostomy history of fistula reports that he is imaging since surgery last year has been somewhat normal. Has been having nausea, vomiting, diarrhea and fever that started 5 days prior to admission denies any sick contacts.  Started having abdominal pain mainly in the left lower quadrant.   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 He status post TAVR loop ileostomy 2021. GI pathogen and C. difficile were negative. HIDA scan showed no evidence of cholecystitis. Started empirically on Rocephin and Flagyl on 06/16/2019.  His fever has resolved, blood cultures have remained negative. Appreciate GIs assistance with the management per GI.  Sickle cell trait ([place]) Noted.  Elevated LFTs HIDA scan showed no acute findings, LFTs are trending down.  CT scan showed no biliary disease. Probably likely due to Crohn's disease, there is slowly improving.  Fever of unclear source: Slowly improving infectious work-up has been negative till date.  Question if this is due to Chron's flare. CT scan showed no abscesses. Continue antibiotics for 7 days course.  Has remained afebrile for last 24 hours.  Protein-calorie malnutrition, severe Noted.  DVT prophylaxis: lovenox Family Communication:none Status is: Inpatient  Remains inpatient appropriate because:Hemodynamically unstable   Dispo: The patient is from: Home              Anticipated d/c is to: Home              Patient currently is not medically stable to d/c.    Difficult to place patient No     Code Status:     Code Status Orders  (From admission, onward)         Start     Ordered   06/15/20 1611  Full code  Continuous        06/15/20 1611        Code Status History    Date Active Date Inactive Code Status Order ID Comments User Context   06/28/2015 1346 07/02/2015 1835 Full Code [id number]  Kirk Ruths, MD Inpatient   06/28/2015 1151 06/28/2015 1346 Full Code [id number]  Ronny Flurry, MD ED   Advance Care Planning Activity        IV Access:    Peripheral IV   Procedures and diagnostic studies:   NM Hepatobiliary Liver Func  Result Date: 06/18/2020 CLINICAL DATA:  Acute abdominal pain. Sickle cell disease. Crohn's disease. EXAM: NUCLEAR MEDICINE HEPATOBILIARY IMAGING TECHNIQUE: Sequential images of the abdomen were obtained out to 60 minutes following intravenous administration of radiopharmaceutical. RADIOPHARMACEUTICALS:  7.3 mCi Tc-55m Choletec IV COMPARISON:  CT 05/15/2020 FINDINGS: Prompt clearance radiotracer from blood pool and homogeneous uptake in liver. Counts are evident in the gallbladder by 18 minutes. Counts are present in the proximal small bowel by 40 minutes. Reflux into the stomach noted. IMPRESSION: 1. Patent cystic duct with normal filling of the gallbladder. 2. Patent common bile duct. 3. No evidence cholecystitis. Electronically Signed   By: SSuzy BouchardM.D.   On: 06/18/2020 13:43   DG CHEST PORT 1 VIEW  Result Date: 06/17/2020 CLINICAL  DATA:  Abdominal pain and fever. History of Crohn's disease and sickle cell disease. EXAM: PORTABLE CHEST 1 VIEW COMPARISON:  None. FINDINGS: Lungs are clear. Heart size and pulmonary vascularity are normal. No adenopathy. No evident bone lesions. IMPRESSION: Lungs clear.  Cardiac silhouette normal. Electronically Signed   By: Lowella Grip III M.D.   On: 06/17/2020 11:50     Medical Consultants:    None.   Subjective:    Luke Richmond relates he is  tolerating his diet, denies any abdominal pain regular, output through his ostomy.  Objective:    Vitals:   06/18/20 1342 06/18/20 1823 06/18/20 2213 06/19/20 0454  BP: 122/73 126/68 114/72 112/65  Pulse: (!) 108 90 86 93  Resp: 14 14 17 16   Temp: (!) 100.6 F (38.1 C) 98.4 F (36.9 C) 98.2 F (36.8 C) 98.8 F (37.1 C)  TempSrc: Oral Oral Oral Oral  SpO2: 99% 100% 100% 99%  Weight:      Height:       SpO2: 99 %   Intake/Output Summary (Last 24 hours) at 06/19/2020 0901 Last data filed at 06/19/2020 6967 Gross per 24 hour  Intake 3292.55 ml  Output 150 ml  Net 3142.55 ml   Filed Weights   06/15/20 1804  Weight: 59 kg    Exam: General exam: In no acute distress. Respiratory system: Good air movement and clear to auscultation. Cardiovascular system: S1 & S2 heard, RRR. No JVD. Gastrointestinal system: Abdomen is nondistended, soft and nontender.  Extremities: No pedal edema. Skin: No rashes, lesions or ulcers Psychiatry: Judgement and insight appear normal. Mood & affect appropriate.    Data Reviewed:    Labs: Basic Metabolic Panel: Recent Labs  Lab 06/15/20 1247 06/16/20 0748 06/17/20 0547 06/18/20 0522 06/19/20 0453  NA 126* 131* 129* 131* 131*  K 4.0 4.3 4.1 3.8 4.1  CL 91* 98 100 97* 102  CO2 24 24 22 26 22   GLUCOSE 116* 108* 100* 107* 102*  BUN 12 7 7 6  5*  CREATININE 0.87 0.72 0.66 0.72 0.54*  CALCIUM 8.8* 8.3* 8.4* 8.6* 8.1*   GFR Estimated Creatinine Clearance: 121.9 mL/min (A) (by C-G formula based on SCr of 0.54 mg/dL (L)). Liver Function Tests: Recent Labs  Lab 06/15/20 1247 06/16/20 0748 06/17/20 0547 06/18/20 0522 06/19/20 0453  AST 241* 204* 149* 163* 96*  ALT 169* 168* 148* 166* 116*  ALKPHOS 193* 174* 261* 411* 370*  BILITOT 6.5* 4.1* 3.9* 3.1* 1.7*  PROT 8.3* 6.5 6.6 7.7 6.2*  ALBUMIN 3.5 2.7* 2.7* 3.1* 2.5*   Recent Labs  Lab 06/15/20 1247  LIPASE 21   No results for input(s): AMMONIA in the last 168  hours. Coagulation profile No results for input(s): INR, PROTIME in the last 168 hours. COVID-19 Labs  No results for input(s): DDIMER, FERRITIN, LDH, CRP in the last 72 hours.  Lab Results  Component Value Date   [place] NEGATIVE 06/15/2020    CBC: Recent Labs  Lab 06/15/20 1247 06/16/20 0748 06/17/20 0547 06/18/20 0522 06/19/20 0453  WBC 2.3* 4.2 5.1 4.3 4.6  NEUTROABS 1.7  --   --   --   --   HGB 15.5 12.6* 12.3* 13.3 10.9*  HCT 48.7 39.0 37.9* 41.3 33.9*  MCV 62.0* 62.2* 61.6* 61.5* 61.6*  PLT 220 136* 176 204 257   Cardiac Enzymes: No results for input(s): CKTOTAL, CKMB, CKMBINDEX, TROPONINI in the last 168 hours. BNP (last 3 results) No results for input(s): PROBNP in the  last 8760 hours. CBG: No results for input(s): GLUCAP in the last 168 hours. D-Dimer: No results for input(s): DDIMER in the last 72 hours. Hgb A1c: No results for input(s): HGBA1C in the last 72 hours. Lipid Profile: No results for input(s): CHOL, HDL, LDLCALC, TRIG, CHOLHDL, LDLDIRECT in the last 72 hours. Thyroid function studies: No results for input(s): TSH, T4TOTAL, T3FREE, THYROIDAB in the last 72 hours.  Invalid input(s): FREET3 Anemia work up: No results for input(s): VITAMINB12, FOLATE, FERRITIN, TIBC, IRON, RETICCTPCT in the last 72 hours. Sepsis Labs: Recent Labs  Lab 06/16/20 0748 06/17/20 0547 06/18/20 0522 06/19/20 0453  WBC 4.2 5.1 4.3 4.6   Microbiology Recent Results (from the past 240 hour(s))  Resp Panel by RT-PCR (Flu A&B, Covid) Nasopharyngeal Swab     Status: None   Collection Time: 06/15/20 12:47 PM   Specimen: Nasopharyngeal Swab; Nasopharyngeal(NP) swabs in vial transport medium  Result Value Ref Range Status   SARS Coronavirus 2 by RT PCR NEGATIVE NEGATIVE Final    Comment: (NOTE) SARS-CoV-2 target nucleic acids are NOT DETECTED.  The SARS-CoV-2 RNA is generally detectable in upper respiratory specimens during the acute phase of infection. The  lowest concentration of SARS-CoV-2 viral copies this assay can detect is 138 copies/mL. A negative result does not preclude SARS-Cov-2 infection and should not be used as the sole basis for treatment or other patient management decisions. A negative result may occur with  improper specimen collection/handling, submission of specimen other than nasopharyngeal swab, presence of viral mutation(s) within the areas targeted by this assay, and inadequate number of viral copies(<138 copies/mL). A negative result must be combined with clinical observations, patient history, and epidemiological information. The expected result is Negative.  Fact Sheet for Patients:  EntrepreneurPulse.com.au  Fact Sheet for Healthcare Providers:  IncredibleEmployment.be  This test is no t yet approved or cleared by the [place] FDA and  has been authorized for detection and/or diagnosis of SARS-CoV-2 by FDA under an Emergency Use Authorization (EUA). This EUA will remain  in effect (meaning this test can be used) for the duration of the COVID-19 declaration under Section 564(b)(1) of the Act, 21 U.S.C.section 360bbb-3(b)(1), unless the authorization is terminated  or revoked sooner.       Influenza A by PCR NEGATIVE NEGATIVE Final   Influenza B by PCR NEGATIVE NEGATIVE Final    Comment: (NOTE) The Xpert Xpress SARS-CoV-2/FLU/RSV plus assay is intended as an aid in the diagnosis of influenza from Nasopharyngeal swab specimens and should not be used as a sole basis for treatment. Nasal washings and aspirates are unacceptable for Xpert Xpress SARS-CoV-2/FLU/RSV testing.  Fact Sheet for Patients: EntrepreneurPulse.com.au  Fact Sheet for Healthcare Providers: IncredibleEmployment.be  This test is not yet approved or cleared by the [place] FDA and has been authorized for detection and/or diagnosis of SARS-CoV-2 by FDA under  an Emergency Use Authorization (EUA). This EUA will remain in effect (meaning this test can be used) for the duration of the COVID-19 declaration under Section 564(b)(1) of the Act, 21 U.S.C. section 360bbb-3(b)(1), unless the authorization is terminated or revoked.  Performed at [facility], [place] [address] [place], [place] [address]   Gastrointestinal Panel by PCR , Stool     Status: None   Collection Time: 06/15/20  3:24 PM   Specimen: Ileostomy; Stool  Result Value Ref Range Status   Campylobacter species NOT DETECTED NOT DETECTED Final   Plesimonas shigelloides NOT DETECTED NOT DETECTED Final  Salmonella species NOT DETECTED NOT DETECTED Final   Yersinia enterocolitica NOT DETECTED NOT DETECTED Final   Vibrio species NOT DETECTED NOT DETECTED Final   Vibrio cholerae NOT DETECTED NOT DETECTED Final   Enteroaggregative E coli (EAEC) NOT DETECTED NOT DETECTED Final   Enteropathogenic E coli (EPEC) NOT DETECTED NOT DETECTED Final   Enterotoxigenic E coli (ETEC) NOT DETECTED NOT DETECTED Final   Shiga like toxin producing E coli (STEC) NOT DETECTED NOT DETECTED Final   Shigella/Enteroinvasive E coli (EIEC) NOT DETECTED NOT DETECTED Final   Cryptosporidium NOT DETECTED NOT DETECTED Final   Cyclospora cayetanensis NOT DETECTED NOT DETECTED Final   Entamoeba histolytica NOT DETECTED NOT DETECTED Final   Giardia lamblia NOT DETECTED NOT DETECTED Final   Adenovirus F40/41 NOT DETECTED NOT DETECTED Final   Astrovirus NOT DETECTED NOT DETECTED Final   Norovirus GI/GII NOT DETECTED NOT DETECTED Final   Rotavirus A NOT DETECTED NOT DETECTED Final   Sapovirus (I, II, IV, and V) NOT DETECTED NOT DETECTED Final    Comment: Performed at [facility], [place] [place], [place] [address]  Culture, blood (routine x 2)     Status: None (Preliminary result)   Collection Time: 06/17/20 10:57 AM   Specimen: BLOOD RIGHT ARM  Result Value Ref Range Status    Specimen Description   Final    BLOOD RIGHT ARM Performed at [facility], [address] [place], [place] [address]    Special Requests   Final    BOTTLES DRAWN AEROBIC ONLY Blood Culture results may not be optimal due to an inadequate volume of blood received in culture bottles Performed at [facility] [address] [place], [place] [address]    Culture   Final    NO GROWTH 2 DAYS Performed at [facility] [address] [place], [place] [address]    Report Status PENDING  Incomplete  Culture, blood (routine x 2)     Status: None (Preliminary result)   Collection Time: 06/17/20 10:57 AM   Specimen: BLOOD RIGHT HAND  Result Value Ref Range Status   Specimen Description   Final    BLOOD RIGHT HAND Performed at [facility] [address], [place], [place] [address]    Special Requests   Final    BOTTLES DRAWN AEROBIC AND ANAEROBIC Blood Culture adequate volume Performed at [facility] [address], [place], [place] [address]    Culture   Final    NO GROWTH 2 DAYS Performed at [facility] [address] [place], [place] [address]    Report Status PENDING  Incomplete  C Difficile Quick Screen w PCR reflex     Status: None   Collection Time: 06/17/20  2:48 PM   Specimen: STOOL  Result Value Ref Range Status   C Diff antigen NEGATIVE NEGATIVE Final   C Diff toxin NEGATIVE NEGATIVE Final   C Diff interpretation No C. difficile detected.  Final    Comment: Performed at [facility], [place] [address], [place], [place] [address]     Medications:   . (feeding supplement) PROSource Plus  30 mL Oral BID BM  . feeding supplement  1 Container Oral TID BM  . ferrous sulfate  325 mg Oral Q breakfast  . multivitamin with minerals  1 tablet Oral Daily   Continuous Infusions: . 0.9 % NaCl with KCl 20 mEq / L 100 mL/hr at  06/19/20 1224  . cefTRIAXone (ROCEPHIN)  IV Stopped  (06/18/20 1730)  . metronidazole 500 mg (06/19/20 0237)      LOS: 4 days   Charlynne Cousins  [facility]  06/19/2020, 9:01 AM

## 2020-06-20 DIAGNOSIS — R112 Nausea with vomiting, unspecified: Secondary | ICD-10-CM

## 2020-06-20 MED ORDER — AMOXICILLIN-POT CLAVULANATE 875-125 MG PO TABS: 1.0000 | ORAL_TABLET | Freq: Two times a day (BID) | ORAL | 0 refills | Status: AC

## 2020-06-20 MED ORDER — METRONIDAZOLE 500 MG PO TABS: 500.0000 mg | ORAL_TABLET | Freq: Three times a day (TID) | ORAL | 0 refills | Status: AC

## 2020-06-20 NOTE — Discharge Summary (Signed)
Physician Discharge Summary  Luke Richmond AJO:[id number] DOB: 2001-01-16 DOA: 06/15/2020  PCP: Karleen Dolphin, MD  Admit date: 06/15/2020 Discharge date: 06/20/2020  Admitted From: Home Disposition:  Home  Recommendations for Outpatient Follow-up:  1. Follow up with GI in 1-2 weeks 2. Please obtain BMP/CBC in one week   Home Health:No Equipment/Devices:none  Discharge Condition:Stable CODE STATUS:Full Diet recommendation: Heart Healthy   Brief/Interim Summary: 20 y.o. male past medical history significant for Crohn's disease (caregiver reports was started recently on risankizumab approximately 6 weeks ago status post ileostomy history of fistula reports that he is imaging since surgery last year has been somewhat normal. Has been having nausea, vomiting, diarrhea and fever that started 5 days prior to admission denies any sick contacts.  Started having abdominal pain mainly in the left lower quadrant.  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 GI was consulted GI pathogens and C. difficile were negative. HIDA scan showed no evidence of cholecystitis. On admission he was started empirically on Rocephin and Flagyl he defervesced culture data remain negative he was tolerating his diet. He was discharged in stable condition to follow-up with GI as an outpatient we will continue Augmentin and Flagyl for total 14 days.  Sickle cell trait: Noted.  Elevate LFTs: Likely due to Crohn's disease flareup HIDA scan was done which remained negative CT scan of the abdomen pelvis showed no acute findings.  Protein caloric  malnutrition: Counseled.  Discharge Instructions  Discharge Instructions    Diet - low sodium heart healthy   Complete by: As directed    Increase activity slowly   Complete by: As directed      Allergies as of 06/20/2020      Reactions   Adalimumab    Developed antibodies to med   Infliximab Other (See Comments)   "Developed Antibodies" Developed antibody to med   Peanut Oil    Throat itches   Peanut-containing Drug Products       Medication List    STOP taking these medications   cephALEXin 500 MG capsule Commonly known as: KEFLEX   mupirocin ointment 2 % Commonly known as: BACTROBAN     TAKE these medications   acetaminophen 500 MG tablet Commonly known as: TYLENOL Take 1,000 mg by mouth every 6 (six) hours as needed for moderate pain.   amoxicillin-clavulanate 875-125 MG tablet Commonly known as: Augmentin Take 1 tablet by mouth 2 (two) times daily for 8 days.   dicyclomine 10 MG capsule Commonly known as: BENTYL Take 10 mg by mouth 3 (three) times daily as needed for spasms.   docusate sodium 100 MG capsule Commonly known as: COLACE Take 100 mg by mouth daily as needed for mild constipation.   ferrous sulfate 325 (65 FE) MG tablet Take 325 mg by mouth daily with breakfast.   metroNIDAZOLE 500 MG tablet Commonly known as: Flagyl Take 1 tablet (500 mg total) by mouth 3 (three) times daily for 8 days.   MULTIVITAMIN ADULT PO Take 1 tablet by mouth daily.   ondansetron 4 MG disintegrating tablet Commonly known as: ZOFRAN-ODT Take 4 mg by mouth every 8 (eight) hours as needed for nausea.  Risankizumab-rzaa 150 MG/ML Soaj Inject 150 mg into the vein every 28 (twenty-eight) days.   traMADol 50 MG tablet Commonly known as: ULTRAM Take 50 mg by mouth every 12 (twelve) hours as needed for moderate pain.       Allergies  Allergen Reactions  . Adalimumab     Developed antibodies to med  . Infliximab Other (See Comments)    "Developed  Antibodies" Developed antibody to med  . Peanut Oil     Throat itches  . Peanut-Containing Drug Products     Consultations:  Gastroenterology   Procedures/Studies: NM Hepatobiliary Liver Func  Result Date: 06/18/2020 CLINICAL DATA:  Acute abdominal pain. Sickle cell disease. Crohn's disease. EXAM: NUCLEAR MEDICINE HEPATOBILIARY IMAGING TECHNIQUE: Sequential images of the abdomen were obtained out to 60 minutes following intravenous administration of radiopharmaceutical. RADIOPHARMACEUTICALS:  7.3 mCi Tc-50m Choletec IV COMPARISON:  CT 05/15/2020 FINDINGS: Prompt clearance radiotracer from blood pool and homogeneous uptake in liver. Counts are evident in the gallbladder by 18 minutes. Counts are present in the proximal small bowel by 40 minutes. Reflux into the stomach noted. IMPRESSION: 1. Patent cystic duct with normal filling of the gallbladder. 2. Patent common bile duct. 3. No evidence cholecystitis. Electronically Signed   By: SSuzy BouchardM.D.   On: 06/18/2020 13:43   CT ABDOMEN PELVIS W CONTRAST  Result Date: 06/15/2020 CLINICAL DATA:  Abdominal pain. Fever. Nausea and vomiting. Diarrhea. Crohn disease. Sickle cell disease. EXAM: CT ABDOMEN AND PELVIS WITH CONTRAST TECHNIQUE: Multidetector CT imaging of the abdomen and pelvis was performed using the standard protocol following bolus administration of intravenous contrast. CONTRAST:  1047mOMNIPAQUE IOHEXOL 300 MG/ML  SOLN COMPARISON:  07/21/2016 FINDINGS: Lower Chest: No acute findings. Hepatobiliary: Tiny sub-cm low-attenuation lesion is seen near the junction the right and left lobes which is too small to characterize but most likely represents a tiny cyst. No other liver lesions identified. Gallbladder is unremarkable. No evidence of biliary ductal dilatation. Pancreas:  No mass or inflammatory changes. Spleen: Within normal limits in size and appearance. Adrenals/Urinary Tract: No masses identified. No evidence of ureteral calculi  or hydronephrosis. Stomach/Bowel: Small bowel is unremarkable in appearance. Mild wall thickening, mucosal enhancement, and loss of normal haustral fold pattern is seen involving the transverse, descending, and rectosigmoid colon, which is similar to prior exam, and consistent with mild chronic colitis. No evidence of abscess. A linear density is seen in the left perianal region extending along the left gluteal cleft. This is incompletely visualized on this exam but is consistent with a perianal fistula. Vascular/Lymphatic: No pathologically enlarged lymph nodes. No acute vascular findings. Reproductive:  No mass or other significant abnormality. Other:  None. Musculoskeletal:  No suspicious bone lesions identified. IMPRESSION: Mild chronic colitis involving the transverse, descending, and rectosigmoid colon, similar in appearance to prior study. No evidence of abscess or bowel obstruction. Left perianal fistula noted, but incompletely visualized on this exam. Nonemergent pelvic MRI without and with contrast could be performed for further evaluation. Electronically Signed   By: JoMarlaine Hind.D.   On: 06/15/2020 14:12   DG CHEST PORT 1 VIEW  Result Date: 06/17/2020 CLINICAL DATA:  Abdominal pain and fever. History of Crohn's disease and sickle cell disease. EXAM: PORTABLE CHEST 1 VIEW COMPARISON:  None. FINDINGS: Lungs are clear. Heart size and pulmonary vascularity are normal. No adenopathy. No evident bone lesions. IMPRESSION: Lungs clear.  Cardiac silhouette normal. Electronically Signed   By: WiLowella GripII M.D.   On:  06/17/2020 11:50       Subjective: No complaints.  Discharge Exam: Vitals:   06/19/20 2001 06/20/20 0532  BP: 117/72 107/77  Pulse: 88 79  Resp: 16 18  Temp: 98.6 F (37 C) 98.1 F (36.7 C)  SpO2: 100% 100%   Vitals:   06/19/20 0454 06/19/20 1334 06/19/20 2001 06/20/20 0532  BP: 112/65 118/69 117/72 107/77  Pulse: 93 90 88 79  Resp: 16 (!) 22 16 18   Temp: 98.8 F  (37.1 C) 98.4 F (36.9 C) 98.6 F (37 C) 98.1 F (36.7 C)  TempSrc: Oral Oral Oral Oral  SpO2: 99% 100% 100% 100%  Weight:      Height:        General: Pt is alert, awake, not in acute distress Cardiovascular: RRR, S1/S2 +, no rubs, no gallops Respiratory: CTA bilaterally, no wheezing, no rhonchi Abdominal: Soft, NT, ND, bowel sounds + Extremities: no edema, no cyanosis    The results of significant diagnostics from this hospitalization (including imaging, microbiology, ancillary and laboratory) are listed below for reference.     Microbiology: Recent Results (from the past 240 hour(s))  Resp Panel by RT-PCR (Flu A&B, Covid) Nasopharyngeal Swab     Status: None   Collection Time: 06/15/20 12:47 PM   Specimen: Nasopharyngeal Swab; Nasopharyngeal(NP) swabs in vial transport medium  Result Value Ref Range Status   SARS Coronavirus 2 by RT PCR NEGATIVE NEGATIVE Final    Comment: (NOTE) SARS-CoV-2 target nucleic acids are NOT DETECTED.  The SARS-CoV-2 RNA is generally detectable in upper respiratory specimens during the acute phase of infection. The lowest concentration of SARS-CoV-2 viral copies this assay can detect is 138 copies/mL. A negative result does not preclude SARS-Cov-2 infection and should not be used as the sole basis for treatment or other patient management decisions. A negative result may occur with  improper specimen collection/handling, submission of specimen other than nasopharyngeal swab, presence of viral mutation(s) within the areas targeted by this assay, and inadequate number of viral copies(<138 copies/mL). A negative result must be combined with clinical observations, patient history, and epidemiological information. The expected result is Negative.  Fact Sheet for Patients:  EntrepreneurPulse.com.au  Fact Sheet for Healthcare Providers:  IncredibleEmployment.be  This test is no t yet approved or cleared by  the [place] FDA and  has been authorized for detection and/or diagnosis of SARS-CoV-2 by FDA under an Emergency Use Authorization (EUA). This EUA will remain  in effect (meaning this test can be used) for the duration of the COVID-19 declaration under Section 564(b)(1) of the Act, 21 U.S.C.section 360bbb-3(b)(1), unless the authorization is terminated  or revoked sooner.       Influenza A by PCR NEGATIVE NEGATIVE Final   Influenza B by PCR NEGATIVE NEGATIVE Final    Comment: (NOTE) The Xpert Xpress SARS-CoV-2/FLU/RSV plus assay is intended as an aid in the diagnosis of influenza from Nasopharyngeal swab specimens and should not be used as a sole basis for treatment. Nasal washings and aspirates are unacceptable for Xpert Xpress SARS-CoV-2/FLU/RSV testing.  Fact Sheet for Patients: EntrepreneurPulse.com.au  Fact Sheet for Healthcare Providers: IncredibleEmployment.be  This test is not yet approved or cleared by the [place] FDA and has been authorized for detection and/or diagnosis of SARS-CoV-2 by FDA under an Emergency Use Authorization (EUA). This EUA will remain in effect (meaning this test can be used) for the duration of the COVID-19 declaration under Section 564(b)(1) of the Act, 21 U.S.C. section 360bbb-3(b)(1), unless the  authorization is terminated or revoked.  Performed at [facility], [place] [address] [place], [place] [address]   Gastrointestinal Panel by PCR , Stool     Status: None   Collection Time: 06/15/20  3:24 PM   Specimen: Ileostomy; Stool  Result Value Ref Range Status   Campylobacter species NOT DETECTED NOT DETECTED Final   Plesimonas shigelloides NOT DETECTED NOT DETECTED Final   Salmonella species NOT DETECTED NOT DETECTED Final   Yersinia enterocolitica NOT DETECTED NOT DETECTED Final   Vibrio species NOT DETECTED NOT DETECTED Final   Vibrio cholerae NOT DETECTED NOT DETECTED Final    Enteroaggregative E coli (EAEC) NOT DETECTED NOT DETECTED Final   Enteropathogenic E coli (EPEC) NOT DETECTED NOT DETECTED Final   Enterotoxigenic E coli (ETEC) NOT DETECTED NOT DETECTED Final   Shiga like toxin producing E coli (STEC) NOT DETECTED NOT DETECTED Final   Shigella/Enteroinvasive E coli (EIEC) NOT DETECTED NOT DETECTED Final   Cryptosporidium NOT DETECTED NOT DETECTED Final   Cyclospora cayetanensis NOT DETECTED NOT DETECTED Final   Entamoeba histolytica NOT DETECTED NOT DETECTED Final   Giardia lamblia NOT DETECTED NOT DETECTED Final   Adenovirus F40/41 NOT DETECTED NOT DETECTED Final   Astrovirus NOT DETECTED NOT DETECTED Final   Norovirus GI/GII NOT DETECTED NOT DETECTED Final   Rotavirus A NOT DETECTED NOT DETECTED Final   Sapovirus (I, II, IV, and V) NOT DETECTED NOT DETECTED Final    Comment: Performed at [facility], [place] [place], [place] [address]  Culture, blood (routine x 2)     Status: None (Preliminary result)   Collection Time: 06/17/20 10:57 AM   Specimen: BLOOD RIGHT ARM  Result Value Ref Range Status   Specimen Description   Final    BLOOD RIGHT ARM Performed at [facility], [address] [place], [place] [address]    Special Requests   Final    BOTTLES DRAWN AEROBIC ONLY Blood Culture results may not be optimal due to an inadequate volume of blood received in culture bottles Performed at [place] [address], [place], [place] [address]    Culture   Final    NO GROWTH 3 DAYS Performed at [facility], [place] [address] [place], [place] [address]    Report Status PENDING  Incomplete  Culture, blood (routine x 2)     Status: None (Preliminary result)   Collection Time: 06/17/20 10:57 AM   Specimen: BLOOD RIGHT HAND  Result Value Ref Range Status   Specimen Description   Final    BLOOD RIGHT HAND Performed at [facility] [place] [address], [place], [place] [address]     Special Requests   Final    BOTTLES DRAWN AEROBIC AND ANAEROBIC Blood Culture adequate volume Performed at [place] [address] [place], [place] [address]    Culture   Final    NO GROWTH 3 DAYS Performed at [facility], [place] [address], [place],  [address]    Report Status PENDING  Incomplete  C Difficile Quick Screen w PCR reflex     Status: None   Collection Time: 06/17/20  2:48 PM   Specimen: STOOL  Result Value Ref Range Status   C Diff antigen NEGATIVE NEGATIVE Final   C Diff toxin NEGATIVE NEGATIVE Final   C Diff interpretation No C. difficile detected.  Final    Comment: Performed at [facility], [place] [address], [place], [place] [address]     Labs: BNP (last 3 results) No results for input(s): BNP in the last 8760 hours. Basic Metabolic Panel: Recent Labs  Lab 06/15/20 1247 06/16/20 0748 06/17/20 0547 06/18/20 0522 06/19/20 0453  NA 126* 131* 129* 131* 131*  K 4.0 4.3 4.1 3.8 4.1  CL 91* 98 100 97* 102  CO2 24 24 22 26 22   GLUCOSE 116* 108* 100* 107* 102*  BUN 12 7 7 6  5*  CREATININE 0.87 0.72 0.66 0.72 0.54*  CALCIUM 8.8* 8.3* 8.4* 8.6* 8.1*   Liver Function Tests: Recent Labs  Lab 06/15/20 1247 06/16/20 0748 06/17/20 0547 06/18/20 0522 06/19/20 0453  AST 241* 204* 149* 163* 96*  ALT 169* 168* 148* 166* 116*  ALKPHOS 193* 174* 261* 411* 370*  BILITOT 6.5* 4.1* 3.9* 3.1* 1.7*  PROT 8.3* 6.5 6.6 7.7 6.2*  ALBUMIN 3.5 2.7* 2.7* 3.1* 2.5*   Recent Labs  Lab 06/15/20 1247  LIPASE 21   No results for input(s): AMMONIA in the last 168 hours. CBC: Recent Labs  Lab 06/15/20 1247 06/16/20 0748 06/17/20 0547 06/18/20 0522 06/19/20 0453  WBC 2.3* 4.2 5.1 4.3 4.6  NEUTROABS 1.7  --   --   --   --   HGB 15.5 12.6* 12.3* 13.3 10.9*  HCT 48.7 39.0 37.9* 41.3 33.9*  MCV 62.0* 62.2* 61.6* 61.5* 61.6*  PLT 220 136* 176 204 257   Cardiac Enzymes: No results for input(s): CKTOTAL, CKMB,  CKMBINDEX, TROPONINI in the last 168 hours. BNP: Invalid input(s): POCBNP CBG: No results for input(s): GLUCAP in the last 168 hours. D-Dimer No results for input(s): DDIMER in the last 72 hours. Hgb A1c No results for input(s): HGBA1C in the last 72 hours. Lipid Profile No results for input(s): CHOL, HDL, LDLCALC, TRIG, CHOLHDL, LDLDIRECT in the last 72 hours. Thyroid function studies No results for input(s): TSH, T4TOTAL, T3FREE, THYROIDAB in the last 72 hours.  Invalid input(s): FREET3 Anemia work up No results for input(s): VITAMINB12, FOLATE, FERRITIN, TIBC, IRON, RETICCTPCT in the last 72 hours. Urinalysis    Component Value Date/Time   COLORURINE AMBER (A) 06/15/2020 1216   APPEARANCEUR CLEAR 06/15/2020 1216   LABSPEC >1.046 (H) 06/15/2020 1216   PHURINE 5.0 06/15/2020 1216   GLUCOSEU NEGATIVE 06/15/2020 1216   HGBUR SMALL (A) 06/15/2020 1216   BILIRUBINUR NEGATIVE 06/15/2020 1216   KETONESUR NEGATIVE 06/15/2020 1216   PROTEINUR NEGATIVE 06/15/2020 1216   NITRITE NEGATIVE 06/15/2020 1216   LEUKOCYTESUR NEGATIVE 06/15/2020 1216   Sepsis Labs Invalid input(s): PROCALCITONIN,  WBC,  LACTICIDVEN Microbiology Recent Results (from the past 240 hour(s))  Resp Panel by RT-PCR (Flu A&B, Covid) Nasopharyngeal Swab     Status: None   Collection Time: 06/15/20 12:47 PM   Specimen: Nasopharyngeal Swab; Nasopharyngeal(NP) swabs in vial transport medium  Result Value Ref Range Status   SARS Coronavirus 2 by RT PCR NEGATIVE NEGATIVE Final    Comment: (NOTE) SARS-CoV-2 target nucleic acids are NOT DETECTED.  The SARS-CoV-2 RNA is generally detectable in upper respiratory specimens during the acute phase of infection. The lowest concentration of SARS-CoV-2 viral copies this assay can detect is 138 copies/mL. A negative result does not preclude SARS-Cov-2 infection and should not be used as the sole basis for treatment or other patient management decisions. A negative result may  occur with  improper specimen collection/handling, submission of specimen other than nasopharyngeal swab, presence of viral mutation(s) within the areas targeted by  this assay, and inadequate number of viral copies(<138 copies/mL). A negative result must be combined with clinical observations, patient history, and epidemiological information. The expected result is Negative.  Fact Sheet for Patients:  EntrepreneurPulse.com.au  Fact Sheet for Healthcare Providers:  IncredibleEmployment.be  This test is no t yet approved or cleared by the [place] FDA and  has been authorized for detection and/or diagnosis of SARS-CoV-2 by FDA under an Emergency Use Authorization (EUA). This EUA will remain  in effect (meaning this test can be used) for the duration of the COVID-19 declaration under Section 564(b)(1) of the Act, 21 U.S.C.section 360bbb-3(b)(1), unless the authorization is terminated  or revoked sooner.       Influenza A by PCR NEGATIVE NEGATIVE Final   Influenza B by PCR NEGATIVE NEGATIVE Final    Comment: (NOTE) The Xpert Xpress SARS-CoV-2/FLU/RSV plus assay is intended as an aid in the diagnosis of influenza from Nasopharyngeal swab specimens and should not be used as a sole basis for treatment. Nasal washings and aspirates are unacceptable for Xpert Xpress SARS-CoV-2/FLU/RSV testing.  Fact Sheet for Patients: EntrepreneurPulse.com.au  Fact Sheet for Healthcare Providers: IncredibleEmployment.be  This test is not yet approved or cleared by the [place] FDA and has been authorized for detection and/or diagnosis of SARS-CoV-2 by FDA under an Emergency Use Authorization (EUA). This EUA will remain in effect (meaning this test can be used) for the duration of the COVID-19 declaration under Section 564(b)(1) of the Act, 21 U.S.C. section 360bbb-3(b)(1), unless the authorization is terminated  or revoked.  Performed at [facility], [place] [address] [place], [place] [address]   Gastrointestinal Panel by PCR , Stool     Status: None   Collection Time: 06/15/20  3:24 PM   Specimen: Ileostomy; Stool  Result Value Ref Range Status   Campylobacter species NOT DETECTED NOT DETECTED Final   Plesimonas shigelloides NOT DETECTED NOT DETECTED Final   Salmonella species NOT DETECTED NOT DETECTED Final   Yersinia enterocolitica NOT DETECTED NOT DETECTED Final   Vibrio species NOT DETECTED NOT DETECTED Final   Vibrio cholerae NOT DETECTED NOT DETECTED Final   Enteroaggregative E coli (EAEC) NOT DETECTED NOT DETECTED Final   Enteropathogenic E coli (EPEC) NOT DETECTED NOT DETECTED Final   Enterotoxigenic E coli (ETEC) NOT DETECTED NOT DETECTED Final   Shiga like toxin producing E coli (STEC) NOT DETECTED NOT DETECTED Final   Shigella/Enteroinvasive E coli (EIEC) NOT DETECTED NOT DETECTED Final   Cryptosporidium NOT DETECTED NOT DETECTED Final   Cyclospora cayetanensis NOT DETECTED NOT DETECTED Final   Entamoeba histolytica NOT DETECTED NOT DETECTED Final   Giardia lamblia NOT DETECTED NOT DETECTED Final   Adenovirus F40/41 NOT DETECTED NOT DETECTED Final   Astrovirus NOT DETECTED NOT DETECTED Final   Norovirus GI/GII NOT DETECTED NOT DETECTED Final   Rotavirus A NOT DETECTED NOT DETECTED Final   Sapovirus (I, II, IV, and V) NOT DETECTED NOT DETECTED Final    Comment: Performed at [facility], [place] [place], [place] [address]  Culture, blood (routine x 2)     Status: None (Preliminary result)   Collection Time: 06/17/20 10:57 AM   Specimen: BLOOD RIGHT ARM  Result Value Ref Range Status   Specimen Description   Final    BLOOD RIGHT ARM Performed at [facility], [address], [place], [place] [address]    Special Requests   Final    BOTTLES DRAWN AEROBIC ONLY Blood Culture  results may not be optimal due to an inadequate volume of  blood received in culture bottles Performed at [facility], [place] [address], [place], [place] [address]    Culture   Final    NO GROWTH 3 DAYS Performed at [facility], [place] [address] [place], [place] [address]    Report Status PENDING  Incomplete  Culture, blood (routine x 2)     Status: None (Preliminary result)   Collection Time: 06/17/20 10:57 AM   Specimen: BLOOD RIGHT HAND  Result Value Ref Range Status   Specimen Description   Final    BLOOD RIGHT HAND Performed at [place] [address] [place], [place] [address]    Special Requests   Final    BOTTLES DRAWN AEROBIC AND ANAEROBIC Blood Culture adequate volume Performed at [place] [address] [place] [address] [place], [place] [address]    Culture   Final    NO GROWTH 3 DAYS Performed at [facility], [place] [address], [place], [place] [address]    Report Status PENDING  Incomplete  C Difficile Quick Screen w PCR reflex     Status: None   Collection Time: 06/17/20  2:48 PM   Specimen: STOOL  Result Value Ref Range Status   C Diff antigen NEGATIVE NEGATIVE Final   C Diff toxin NEGATIVE NEGATIVE Final   C Diff interpretation No C. difficile detected.  Final    Comment: Performed at [facility], [place] [address] [place], [place] [address]     Time coordinating discharge: Over 30 minutes  SIGNED:   Charlynne Cousins, MD  [facility] 06/20/2020, 8:13 AM Pager   If 7PM-7AM, please contact night-coverage [email or web address] Password [email or web address]

## 2020-06-20 NOTE — Discharge Instructions (Signed)
Luke Richmond was admitted to the Hospital on 06/15/2020 and Discharged on Discharge Date 06/20/2020 and should be excused from work/school   for 7  days starting 06/15/2020 , may return to work/school without any restrictions.  Call Bess Harvest MD, [facility] [phone] with questions.  Charlynne Cousins M.D on 06/20/2020,at 9:31 AM  [facility] Office  [phone]   Suggestions For Increasing Calories And Protein . Several small meals a day are easier to eat and digest than three large ones. Space meals about 2 to 3 hours apart to maximize comfort. . Stop eating 2 to 3 hours before bed and sleep with your head elevated if gastric reflux (GERD) and heartburn are problems. . Do not eat your favorite foods if you are feeling bad. Save them for when you feel good! . Eat breakfast-type foods at any meal. Eggs are usually easy to eat and are great any time of the day. (The same goes for pancakes and waffles.) . Eat when you feel hungry. Most people have the greatest appetite in the morning because they have not eaten all night. If this is the best meal for you, then pile on those calories and other nutrients in the morning and at lunch. Then you can have a smaller dinner without losing total calories for the day. . Eat leftovers or nutritious snacks in the afternoon and early evening to round out your day. . Try homemade or commercially prepared nutrition bars and puddings, as well as calorie- and protein-rich liquid nutritional supplements. Benefits of Physical Activity Talk to your doctor about physical activity. Light or moderate physical activity can help maintain muscle and promote an appetite. Walking in the neighborhood or the local mall is a great way to get up, get out, and get moving. If you are unsteady on your feet, try walking around the dining room table. Save Room for [employer]! Drink most fluids between meals instead of with meals. (It is fine to have a  sip to help swallow food at meal time.) Fluids (which usually have fewer calories and nutrients than solid food) can take up valuable space in your stomach.  Foods Recommended High-Protein Foods Milk products Add cheese to toast, crackers, sandwiches, baked potatoes, vegetables, soups, noodles, meat, and fruit. Use reduced-fat (2%) or whole milk in place of water when cooking cereal and cream soups. Include cream sauces on vegetables and pasta. Add powdered milk to cream soups and mashed potatoes.  Eggs Have hard-cooked eggs readily available in the refrigerator. Chop and add to salads, casseroles, soups, and vegetables. Make a quick egg salad. All eggs should be well cooked to avoid the risk of harmful bacteria.  Meats, poultry, and fish Add leftover cooked meats to soups, casseroles, salads, and omelets. Make dip by mixing diced, chopped, or shredded meat with sour cream and spices.  Beans, legumes, nuts, and seeds Sprinkle nuts and seeds on cereals, fruit, and desserts such as ice cream, pudding, and custard. Also serve nuts and seeds on vegetables, salads, and pasta. Spread peanut butter on toast, bread, English muffins, and fruit, or blend it in a milk shake. Add beans and peas to salads, soups, casseroles, and vegetable dishes.  High-Calorie Foods Butter, margarine, and  oils Melt butter or margarine over potatoes, rice, pasta, and cooked vegetables. Add melted butter or margarine into soups and casseroles and spread on bread for sandwiches before spreading sandwich spread or peanut butter. Saut or stir-fry vegetables, meats, chicken and fish such as shrimp/scallops in  olive or canola oil. A variety of oils add calories and can be used to [employer], chicken, or fish.  Milk products Add whipping cream to desserts, pancakes, waffles, fruit, and hot chocolate, and fold it into soups and casseroles. Add sour cream to baked potatoes and vegetables.  Salad dressing Use regular (not low-fat or diet)  mayonnaise and salad dressing on sandwiches and in dips with vegetables and fruit.   Sweets Add jelly and honey to bread and crackers. Add jam to fruit and ice cream and as a topping over cake.   Copyright 2020  [employer]. All rights reserved. [facility] Stay Proper nutrition can help your body recover from illness and injury.   Foods and beverages high in protein, vitamins, and minerals help rebuild muscle loss, promote healing, & reduce fall risk.   .In addition to eating healthy foods, a nutrition shake is an easy, delicious way to get the nutrition you need during and after your hospital stay  It is recommended that you consider using [employer] unflavored protein powder to help increase protein intake  Tips for adding a nutrition shake into your routine: As allowed, drink one with vitamins or medications instead of water or juice Enjoy one as a tasty mid-morning or afternoon snack Drink cold or make a milkshake out of it Drink one instead of milk with cereal or snacks Use as a coffee creamer   Available at the following grocery stores and pharmacies:           * [facility] [phone]            For COUPONS visit: www.ensure.com/join or http://dawson-may.com/   Suggested Substitutions Ensure Plus = Boost Plus = Carnation Breakfast Essentials = Boost Compact Ensure Active Clear = Boost Breeze Glucerna Shake = Boost Glucose Control = Carnation Breakfast Essentials SUGAR FREE

## 2020-06-22 LAB — CULTURE, BLOOD (ROUTINE X 2)
Culture: NO GROWTH
Culture: NO GROWTH
Special Requests: ADEQUATE

## 2022-04-03 ENCOUNTER — Telehealth: Payer: Self-pay | Admitting: Pediatrics

## 2022-04-03 NOTE — Telephone Encounter (Signed)
Patient mom is a patient of Dr.Greene. Mom wants to know if Dr.Greene would take her son on as a NP.

## 2022-04-06 NOTE — Telephone Encounter (Signed)
Pt was called lmom for the pt to call the office to get scheduled for a newpt appointment.

## 2022-04-06 NOTE — Telephone Encounter (Signed)
Yes - I am happy to accept him as new patient. Please have last few notes from prior PCP sent prior to establish care visit. Thanks.

## 2022-04-06 NOTE — Telephone Encounter (Signed)
Okay to schedule new patient apt

## 2022-04-13 ENCOUNTER — Telehealth: Payer: Self-pay

## 2022-04-13 NOTE — Telephone Encounter (Signed)
Luke Richmond called to schedule new patient appt.   (For these scheduling requests they do not come back to the clinical pool just put note you left a message and do not route it

## 2022-04-13 NOTE — Telephone Encounter (Signed)
Caller name: Danil Depena  On [place]?: Yes  Call back number: [phone]  Provider they see: Carlota Raspberry  Reason for call: No answer left message

## 2022-04-13 NOTE — Telephone Encounter (Signed)
Pt is asking if he can become a new pt of DR  Carlota Raspberry . His mother Metro Kung is a pt of Dr Vonna Kotyk

## 2022-04-13 NOTE — Telephone Encounter (Signed)
Yes I agree to see him as a new patient, okay to schedule establish care visit.

## 2022-04-13 NOTE — Telephone Encounter (Signed)
Caller name: Halim Cypret  On DPR?: Yes  Call back number: [phone] (mobile)  Provider they see:   Reason for call:Pt would like to become new pt. Mother is already Est with Dr Carlota Raspberry please advise.Marland KitchenMarland Kitchen

## 2022-07-02 ENCOUNTER — Ambulatory Visit: Payer: No Typology Code available for payment source | Admitting: Family Medicine

## 2022-07-02 ENCOUNTER — Encounter: Payer: Self-pay | Admitting: Family Medicine

## 2022-07-02 VITALS — BP 112/78 | HR 85 | Temp 98.7°F | Ht 76.0 in | Wt 135.2 lb

## 2022-07-02 DIAGNOSIS — K50113 Crohn's disease of large intestine with fistula: Secondary | ICD-10-CM

## 2022-07-02 DIAGNOSIS — R5383 Other fatigue: Secondary | ICD-10-CM | POA: Diagnosis not present

## 2022-07-02 DIAGNOSIS — G4700 Insomnia, unspecified: Secondary | ICD-10-CM

## 2022-07-02 DIAGNOSIS — Z939 Artificial opening status, unspecified: Secondary | ICD-10-CM

## 2022-07-02 NOTE — Progress Notes (Signed)
Subjective:  Patient ID: Luke Richmond, male    DOB: [birth year]  Age: 22 y.o. MRN: [id number]  CC:  Chief Complaint  Patient presents with   Annual Exam   Abdominal Pain    Abdominal pain comes and goes with stress   Fatigue    Pt states he is always tired sleeps a lot and no energy    HPI [name] presents for   New patient to establish as PCP with me (prior Dr. Donnie Coffin) me with concerns above.   Crohn's disease Hx of Crohn's disease of large intestine with fistula, ileostomy few years ago.  GI - Dr. Chesley Mires at [facility].  Having trouble keeping bag attached.  Has contacted specialist last week about this concern. Call to GI about letter for school, but not about bag issues on 4/30.  Last saw surgeon - Dr. Drue Dun in May 2023.  Some trouble keeping weight on - not losing, just trouble gaining.  Wt Readings from Last 3 Encounters:  07/02/22 135 lb 3.2 oz (61.3 kg)  06/15/20 130 lb 1.1 oz (59 kg)  12/25/16 123 lb 5 oz (55.9 kg) (11 %, Z= -1.21)*   * Growth percentiles are based on CDC (Boys, 2-20 Years) data.   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 Still feeling fatigued after leaving [employer]. [employer] school during the day - plan on cybersecurity - 1 year of school left.  Hx of sickle cell trait - HGB 14.6 in 12/2021.  Bedtime 11-12pm.  Wakening at 2-3am, scrolls on phone, then stays up until about 5 am, back to sleep for 2 hours, then gets ready for school.  Some stress with school/work. Thinking of this when wakening. 5-6 hrs per night. No daytime naps. Fatigued during the day.  Stomach feels upset at times when stressed. No new/current pain. No fever/n/v.  Has tried ashwagandha once per week - helped some Melatonin - tried in past, not current. Helped some but more tired during day.  Caffeine - some tea at night. TV before bed.  No prior treatments for depression/anxiety.       07/02/2022    3:59 PM  Depression screen PHQ 2/9  Decreased Interest 3  Down, Depressed, Hopeless 0  PHQ - 2 Score 3  Altered sleeping 3  Tired, decreased energy 2  Change in appetite 2  Feeling bad or failure about yourself  0  Trouble concentrating 1  Moving slowly or fidgety/restless 2  Suicidal thoughts 0  PHQ-9 Score 13  Difficult doing work/chores Somewhat difficult       No data to display                 History Patient Active Problem List   Diagnosis Date Noted   Protein-calorie malnutrition, severe 06/18/2020   Elevated LFTs 06/15/2020   Nausea and vomiting 06/15/2020   Ingrown toenail 07/05/2018   Crohn's disease of large intestine with fistula (HCC) 04/20/2017   Rectal pain 04/02/2017   Perirectal abscess 02/02/2017   Sickle cell trait (HCC) 10/01/2016   Iron deficiency anemia    Moderate malnutrition (HCC)    Crohn's disease with complication (HCC)    Diarrhea 01/12/2014   Hematochezia 01/12/2014   Past Medical History:  Diagnosis Date   Crohn disease (HCC)    Symptoms 10/2013, diagnosed 02/2014, last remicade 500mg  05/31/2015   Sickle cell anemia (HCC)    Sickle cell trait (HCC)  Past Surgical History:  Procedure Laterality Date   ILEOSTOMY  2021   Allergies  Allergen Reactions   Adalimumab     Developed antibodies to med   Infliximab Other (See Comments)    "Developed Antibodies" Developed antibody to med   Peanut Oil     Throat itches   Peanut-Containing Drug Products    Prior to Admission medications   Medication Sig Start Date End Date Taking? Authorizing Provider  acetaminophen (TYLENOL) 500 MG tablet Take 1,000 mg by mouth every 6 (six) hours as needed for moderate pain.   Yes [provider]  Multiple Vitamins-Minerals (MULTIVITAMIN ADULT PO) Take 1 tablet by mouth daily.   Yes [provider]  Risankizumab-rzaa 150 MG/ML SOAJ Inject 150 mg into the vein every 28 (twenty-eight) days. 04/16/20  Yes [provider]  dicyclomine (BENTYL) 10 MG capsule Take 10 mg by mouth 3 (three) times daily as needed for spasms. Patient not taking: Reported on 07/02/2022 09/21/19   [provider]  docusate sodium (COLACE) 100 MG capsule Take 100 mg by mouth daily as needed for mild constipation. Patient not taking: Reported on 07/02/2022    [provider]  ferrous sulfate 325 (65 FE) MG tablet Take 325 mg by mouth daily with breakfast. Patient not taking: Reported on 07/02/2022    [provider]  ondansetron (ZOFRAN-ODT) 4 MG disintegrating tablet Take 4 mg by mouth every 8 (eight) hours as needed for nausea. Patient not taking: Reported on 07/02/2022 12/25/17   [provider]  traMADol (ULTRAM) 50 MG tablet Take 50 mg by mouth every 12 (twelve) hours as needed for moderate pain. Patient not taking: Reported on 07/02/2022 04/13/17   [provider]   Social History   Socioeconomic History   Marital status: Single    Spouse name: Not on file   Number of children: Not on file   Years of education: Not on file   Highest education level: Not on file  Occupational History   Occupation: Delivery driver  Tobacco Use   Smoking status: Never   Smokeless tobacco: Never  Vaping Use   Vaping Use: Never used  Substance and Sexual Activity   Alcohol use: No   Drug use: No   Sexual activity: Never    Birth control/protection: Abstinence  Other Topics Concern   Not on file  Social History Narrative   Lives at home with mother.  1 dog.  No smoke exposure.   Social Determinants of Health   Financial Resource Strain: Low Risk  (07/02/2022)   Overall Financial Resource Strain (CARDIA)    Difficulty of Paying Living Expenses: Not hard at all  Food Insecurity: No Food Insecurity (07/02/2022)   Hunger Vital Sign    Worried About Running Out of Food in the Last Year: Never true    Ran Out of Food in the Last Year: Never true  Transportation Needs: No Transportation Needs  (07/02/2022)   PRAPARE - Administrator, Civil Service (Medical): No    Lack of Transportation (Non-Medical): No  Physical Activity: Sufficiently Active (07/02/2022)   Exercise Vital Sign    Days of Exercise per Week: 7 days    Minutes of Exercise per Session: 40 min  Stress: Stress Concern Present (07/02/2022)   [employer] of Occupational Health - Occupational Stress Questionnaire    Feeling of Stress : Rather much  Social Connections: Socially Isolated (07/02/2022)   Social Connection and Isolation Panel [NHANES]  Frequency of Communication with Friends and Family: Three times a week    Frequency of Social Gatherings with Friends and Family: Once a week    Attends Religious Services: Never    Database administrator or Organizations: No    Attends Banker Meetings: Never    Marital Status: Never married  Intimate Partner Violence: Not At Risk (07/02/2022)   Humiliation, Afraid, Rape, and Kick questionnaire    Fear of Current or Ex-Partner: No    Emotionally Abused: No    Physically Abused: No    Sexually Abused: No    Review of Systems  Per HPI Objective:   Vitals:   07/02/22 1551  BP: 112/78  Pulse: 85  Temp: 98.7 F (37.1 C)  TempSrc: Temporal  SpO2: 96%  Weight: 135 lb 3.2 oz (61.3 kg)  Height: 6\' 4"  (1.93 m)    Physical Exam Vitals reviewed.  Constitutional:      Appearance: He is well-developed.  HENT:     Head: Normocephalic and atraumatic.  Neck:     Vascular: No carotid bruit or JVD.  Cardiovascular:     Rate and Rhythm: Normal rate and regular rhythm.     Heart sounds: Normal heart sounds. No murmur heard. Pulmonary:     Effort: Pulmonary effort is normal.     Breath sounds: Normal breath sounds. No rales.  Abdominal:     Comments: Ostomy intact, bag adherent with some lifting of outside portion of adhesive.  No leakage appreciated, no surrounding skin irritation.  Musculoskeletal:     Right lower leg: No edema.     Left  lower leg: No edema.  Skin:    General: Skin is warm and dry.  Neurological:     Mental Status: He is alert and oriented to person, place, and time.  Psychiatric:        Mood and Affect: Mood normal.     52 minutes spent during visit, including chart review, discussion of previous treatments, current symptoms of fatigue, sleep issues, ileostomy concerns, counseling and assimilation of information, exam, discussion of plan, and chart completion.   Assessment & Plan:  Jurrell Kintner is a 22 y.o. male . Fatigue, unspecified type Insomnia, unspecified type  -Persistent fatigue, suspect this is due in part to his sleep hygiene, interrupted sleep throughout the night.  Decreased total sleep volume.  Overall seems to be managing stressors fairly well but handout given on stress management, discussed improved sleep hygiene for improved sleep volume, option of low-dose melatonin, handout given, 1 month follow-up.  If persistent fatigue with improved sleep, will check other causes including updated blood work.  Most recent hemoglobin normal.  Blood work deferred at this time given chronicity of fatigue without recent changes  Crohn's disease of large intestine with fistula (HCC) History of creation of ostomy (HCC)  -Some concern with fit of adhesive for ostomy bag.  No sign of leakage at this time.  Recommend he contact his surgeon or gastroenterologist as they may have other recommendations on ostomy bag fit or adhesive/size.  Advised to let me know if he has difficulty contacting his specialists and I can call as well.  No orders of the defined types were placed in this encounter.  Patient Instructions  Call your gastroenterologist or surgeon about the issues with bag fit/attachment. Let me know if you have difficulty in touch with your specialists about the bag fit.   For fatigue, it appears sleep issues are the main cause.  See information on sleep hygiene below. Avoid electronic media (phone, tv)  before bed and in middle of the night.  If you wake up - try reading a book, and make a list of concerns to address the next day when you are more awake.  Melatonin - low dose ok for now.  See info on managing stress below. We will discuss this further next time as well.  Thanks for coming in today.   Insomnia Insomnia is a sleep disorder that makes it difficult to fall asleep or stay asleep. Insomnia can cause fatigue, low energy, difficulty concentrating, mood swings, and poor performance at work or school. There are three different ways to classify insomnia: Difficulty falling asleep. Difficulty staying asleep. Waking up too early in the morning. Any type of insomnia can be long-term (chronic) or short-term (acute). Both are common. Short-term insomnia usually lasts for 3 months or less. Chronic insomnia occurs at least three times a week for longer than 3 months. What are the causes? Insomnia may be caused by another condition, situation, or substance, such as: Having certain mental health conditions, such as anxiety and depression. Using caffeine, alcohol, tobacco, or drugs. Having gastrointestinal conditions, such as gastroesophageal reflux disease (GERD). Having certain medical conditions. These include: Asthma. Alzheimer's disease. Stroke. Chronic pain. An overactive thyroid gland (hyperthyroidism). Other sleep disorders, such as restless legs syndrome and sleep apnea. Menopause. Sometimes, the cause of insomnia may not be known. What increases the risk? Risk factors for insomnia include: Gender. Females are affected more often than males. Age. Insomnia is more common as people get older. Stress and certain medical and mental health conditions. Lack of exercise. Having an irregular work schedule. This may include working night shifts and traveling between different time zones. What are the signs or symptoms? If you have insomnia, the main symptom is having trouble falling  asleep or having trouble staying asleep. This may lead to other symptoms, such as: Feeling tired or having low energy. Feeling nervous about going to sleep. Not feeling rested in the morning. Having trouble concentrating. Feeling irritable, anxious, or depressed. How is this diagnosed? This condition may be diagnosed based on: Your symptoms and medical history. Your health care provider may ask about: Your sleep habits. Any medical conditions you have. Your mental health. A physical exam. How is this treated? Treatment for insomnia depends on the cause. Treatment may focus on treating an underlying condition that is causing the insomnia. Treatment may also include: Medicines to help you sleep. Counseling or therapy. Lifestyle adjustments to help you sleep better. Follow these instructions at home: Eating and drinking  Limit or avoid alcohol, caffeinated beverages, and products that contain nicotine and tobacco, especially close to bedtime. These can disrupt your sleep. Do not eat a large meal or eat spicy foods right before bedtime. This can lead to digestive discomfort that can make it hard for you to sleep. Sleep habits  Keep a sleep diary to help you and your health care provider figure out what could be causing your insomnia. Write down: When you sleep. When you wake up during the night. How well you sleep and how rested you feel the next day. Any side effects of medicines you are taking. What you eat and drink. Make your bedroom a dark, comfortable place where it is easy to fall asleep. Put up shades or blackout curtains to block light from outside. Use a white noise machine to block noise. Keep the temperature cool. Limit screen use before  bedtime. This includes: Not watching TV. Not using your smartphone, tablet, or computer. Stick to a routine that includes going to bed and waking up at the same times every day and night. This can help you fall asleep faster. Consider  making a quiet activity, such as reading, part of your nighttime routine. Try to avoid taking naps during the day so that you sleep better at night. Get out of bed if you are still awake after 15 minutes of trying to sleep. Keep the lights down, but try reading or doing a quiet activity. When you feel sleepy, go back to bed. General instructions Take over-the-counter and prescription medicines only as told by your health care provider. Exercise regularly as told by your health care provider. However, avoid exercising in the hours right before bedtime. Use relaxation techniques to manage stress. Ask your health care provider to suggest some techniques that may work well for you. These may include: Breathing exercises. Routines to release muscle tension. Visualizing peaceful scenes. Make sure that you drive carefully. Do not drive if you feel very sleepy. Keep all follow-up visits. This is important. Contact a health care provider if: You are tired throughout the day. You have trouble in your daily routine due to sleepiness. You continue to have sleep problems, or your sleep problems get worse. Get help right away if: You have thoughts about hurting yourself or someone else. Get help right away if you feel like you may hurt yourself or others, or have thoughts about taking your own life. Go to your nearest emergency room or: Call 911. Call the National Suicide Prevention Lifeline at 541-365-1791 or 988. This is open 24 hours a day. Text the Crisis Text Line at 513-559-8992. Summary Insomnia is a sleep disorder that makes it difficult to fall asleep or stay asleep. Insomnia can be long-term (chronic) or short-term (acute). Treatment for insomnia depends on the cause. Treatment may focus on treating an underlying condition that is causing the insomnia. Keep a sleep diary to help you and your health care provider figure out what could be causing your insomnia. This information is not intended to  replace advice given to you by your health care provider. Make sure you discuss any questions you have with your health care provider. Document Revised: 01/20/2021 Document Reviewed: 01/20/2021 Elsevier Patient Education  2023 Elsevier Inc.    Managing Stress, Adult Feeling a certain amount of stress is normal. Stress helps our body and mind get ready to deal with the demands of life. Stress hormones can motivate you to do well at work and meet your responsibilities. But severe or long-term (chronic) stress can affect your mental and physical health. Chronic stress puts you at higher risk for: Anxiety and depression. Other health problems such as digestive problems, muscle aches, heart disease, high blood pressure, and stroke. What are the causes? Common causes of stress include: Demands from work, such as deadlines, feeling overworked, or having long hours. Pressures at home, such as money issues, disagreements with a spouse, or parenting issues. Pressures from major life changes, such as divorce, moving, loss of a loved one, or chronic illness. You may be at higher risk for stress-related problems if you: Do not get enough sleep. Are in poor health. Do not have emotional support. Have a mental health disorder such as anxiety or depression. How to recognize stress Stress can make you: Have trouble sleeping. Feel sad, anxious, irritable, or overwhelmed. Lose your appetite. Overeat or want to eat unhealthy foods.  Want to use drugs or alcohol. Stress can also cause physical symptoms, such as: Sore, tense muscles, especially in the shoulders and neck. Headaches. Trouble breathing. A faster heart rate. Stomach pain, nausea, or vomiting. Diarrhea or constipation. Trouble concentrating. Follow these instructions at home: Eating and drinking Eat a healthy diet. This includes: Eating foods that are high in fiber, such as beans, whole grains, and fresh fruits and vegetables. Limiting  foods that are high in fat and processed sugars, such as fried or sweet foods. Do not skip meals or overeat. Drink enough fluid to keep your urine pale yellow. Alcohol use Do not drink alcohol if: Your health care provider tells you not to drink. You are pregnant, may be pregnant, or are planning to become pregnant. Drinking alcohol is a way some people try to ease their stress. This can be dangerous, so if you drink alcohol: Limit how much you have to: 0-1 drink a day for women. 0-2 drinks a day for men. Know how much alcohol is in your drink. In the [place] one drink equals one 12 oz bottle of beer (355 mL), one 5 oz glass of wine (148 mL), or one 1 oz glass of hard liquor (44 mL). Activity  Include 30 minutes of exercise in your daily schedule. Exercise is a good stress reducer. Include time in your day for an activity that you find relaxing. Try taking a walk, going on a bike ride, reading a book, or listening to music. Schedule your time in a way that lowers stress, and keep a regular schedule. Focus on doing what is most important to get done. Lifestyle Identify the source of your stress and your reaction to it. See a therapist who can help you change unhelpful reactions. When there are stressful events: Talk about them with family, friends, or coworkers. Try to think realistically about stressful events and not ignore them or overreact. Try to find the positives in a stressful situation and not focus on the negatives. Cut back on responsibilities at work and home, if possible. Ask for help from friends or family members if you need it. Find ways to manage stress, such as: Mindfulness, meditation, or deep breathing. Yoga or tai chi. Progressive muscle relaxation. Spending time in nature. Doing art, playing music, or reading. Making time for fun activities. Spending time with family and friends. Get support from family, friends, or spiritual resources. General instructions Get  enough sleep. Try to go to sleep and get up at about the same time every day. Take over-the-counter and prescription medicines only as told by your health care provider. Do not use any products that contain nicotine or tobacco. These products include cigarettes, chewing tobacco, and vaping devices, such as e-cigarettes. If you need help quitting, ask your health care provider. Do not use drugs or smoke to deal with stress. Keep all follow-up visits. This is important. Where to find support Talk with your health care provider about stress management or finding a support group. Find a therapist to work with you on your stress management techniques. Where to find more information [employer] on Mental Illness: www.nami.org American Psychological Association: [employer].ca Contact a health care provider if: Your stress symptoms get worse. You are unable to manage your stress at home. You are struggling to stop using drugs or alcohol. Get help right away if: You may be a danger to yourself or others. You have any thoughts of death or suicide. Get help right awayif you feel like you may  hurt yourself or others, or have thoughts about taking your own life. Go to your nearest emergency room or: Call 911. Call the National Suicide Prevention Lifeline at 4581855349 or 988 in the [place] This is open 24 hours a day. Text the Crisis Text Line at (602)853-5132. Summary Feeling a certain amount of stress is normal, but severe or long-term (chronic) stress can affect your mental and physical health. Chronic stress can put you at higher risk for anxiety, depression, and other health problems such as digestive problems, muscle aches, heart disease, high blood pressure, and stroke. You may be at higher risk for stress-related problems if you do not get enough sleep, are in poor health, lack emotional support, or have a mental health disorder such as anxiety or depression. Identify the source of your stress  and your reaction to it. Try talking about stressful events with family, friends, or coworkers, finding a coping method, or getting support from spiritual resources. If you need more help, talk with your health care provider about finding a support group or a mental health therapist. This information is not intended to replace advice given to you by your health care provider. Make sure you discuss any questions you have with your health care provider. Document Revised: 09/05/2020 Document Reviewed: 09/03/2020 Elsevier Patient Education  2023 Elsevier Inc.       Signed,   Meredith Staggers, MD [facility], [facility] 07/02/22 5:27 PM

## 2022-07-02 NOTE — Patient Instructions (Addendum)
Call your gastroenterologist or surgeon about the issues with bag fit/attachment. Let me know if you have difficulty in touch with your specialists about the bag fit.   For fatigue, it appears sleep issues are the main cause.  See information on sleep hygiene below. Avoid electronic media (phone, tv) before bed and in middle of the night.  If you wake up - try reading a book, and make a list of concerns to address the next day when you are more awake.  Melatonin - low dose ok for now.  See info on managing stress below. We will discuss this further next time as well.  Thanks for coming in today.   Insomnia Insomnia is a sleep disorder that makes it difficult to fall asleep or stay asleep. Insomnia can cause fatigue, low energy, difficulty concentrating, mood swings, and poor performance at work or school. There are three different ways to classify insomnia: Difficulty falling asleep. Difficulty staying asleep. Waking up too early in the morning. Any type of insomnia can be long-term (chronic) or short-term (acute). Both are common. Short-term insomnia usually lasts for 3 months or less. Chronic insomnia occurs at least three times a week for longer than 3 months. What are the causes? Insomnia may be caused by another condition, situation, or substance, such as: Having certain mental health conditions, such as anxiety and depression. Using caffeine, alcohol, tobacco, or drugs. Having gastrointestinal conditions, such as gastroesophageal reflux disease (GERD). Having certain medical conditions. These include: Asthma. Alzheimer's disease. Stroke. Chronic pain. An overactive thyroid gland (hyperthyroidism). Other sleep disorders, such as restless legs syndrome and sleep apnea. Menopause. Sometimes, the cause of insomnia may not be known. What increases the risk? Risk factors for insomnia include: Gender. Females are affected more often than males. Age. Insomnia is more common as people  get older. Stress and certain medical and mental health conditions. Lack of exercise. Having an irregular work schedule. This may include working night shifts and traveling between different time zones. What are the signs or symptoms? If you have insomnia, the main symptom is having trouble falling asleep or having trouble staying asleep. This may lead to other symptoms, such as: Feeling tired or having low energy. Feeling nervous about going to sleep. Not feeling rested in the morning. Having trouble concentrating. Feeling irritable, anxious, or depressed. How is this diagnosed? This condition may be diagnosed based on: Your symptoms and medical history. Your health care provider may ask about: Your sleep habits. Any medical conditions you have. Your mental health. A physical exam. How is this treated? Treatment for insomnia depends on the cause. Treatment may focus on treating an underlying condition that is causing the insomnia. Treatment may also include: Medicines to help you sleep. Counseling or therapy. Lifestyle adjustments to help you sleep better. Follow these instructions at home: Eating and drinking  Limit or avoid alcohol, caffeinated beverages, and products that contain nicotine and tobacco, especially close to bedtime. These can disrupt your sleep. Do not eat a large meal or eat spicy foods right before bedtime. This can lead to digestive discomfort that can make it hard for you to sleep. Sleep habits  Keep a sleep diary to help you and your health care provider figure out what could be causing your insomnia. Write down: When you sleep. When you wake up during the night. How well you sleep and how rested you feel the next day. Any side effects of medicines you are taking. What you eat and drink. Make your  bedroom a dark, comfortable place where it is easy to fall asleep. Put up shades or blackout curtains to block light from outside. Use a white noise machine to  block noise. Keep the temperature cool. Limit screen use before bedtime. This includes: Not watching TV. Not using your smartphone, tablet, or computer. Stick to a routine that includes going to bed and waking up at the same times every day and night. This can help you fall asleep faster. Consider making a quiet activity, such as reading, part of your nighttime routine. Try to avoid taking naps during the day so that you sleep better at night. Get out of bed if you are still awake after 15 minutes of trying to sleep. Keep the lights down, but try reading or doing a quiet activity. When you feel sleepy, go back to bed. General instructions Take over-the-counter and prescription medicines only as told by your health care provider. Exercise regularly as told by your health care provider. However, avoid exercising in the hours right before bedtime. Use relaxation techniques to manage stress. Ask your health care provider to suggest some techniques that may work well for you. These may include: Breathing exercises. Routines to release muscle tension. Visualizing peaceful scenes. Make sure that you drive carefully. Do not drive if you feel very sleepy. Keep all follow-up visits. This is important. Contact a health care provider if: You are tired throughout the day. You have trouble in your daily routine due to sleepiness. You continue to have sleep problems, or your sleep problems get worse. Get help right away if: You have thoughts about hurting yourself or someone else. Get help right away if you feel like you may hurt yourself or others, or have thoughts about taking your own life. Go to your nearest emergency room or: Call 911. Call the National Suicide Prevention Lifeline at (726)755-2125 or 988. This is open 24 hours a day. Text the Crisis Text Line at 575-439-4258. Summary Insomnia is a sleep disorder that makes it difficult to fall asleep or stay asleep. Insomnia can be long-term (chronic)  or short-term (acute). Treatment for insomnia depends on the cause. Treatment may focus on treating an underlying condition that is causing the insomnia. Keep a sleep diary to help you and your health care provider figure out what could be causing your insomnia. This information is not intended to replace advice given to you by your health care provider. Make sure you discuss any questions you have with your health care provider. Document Revised: 01/20/2021 Document Reviewed: 01/20/2021 Elsevier Patient Education  2023 [employer]    Managing Stress, Adult Feeling a certain amount of stress is normal. Stress helps our body and mind get ready to deal with the demands of life. Stress hormones can motivate you to do well at work and meet your responsibilities. But severe or long-term (chronic) stress can affect your mental and physical health. Chronic stress puts you at higher risk for: Anxiety and depression. Other health problems such as digestive problems, muscle aches, heart disease, high blood pressure, and stroke. What are the causes? Common causes of stress include: Demands from work, such as deadlines, feeling overworked, or having long hours. Pressures at home, such as money issues, disagreements with a spouse, or parenting issues. Pressures from major life changes, such as divorce, moving, loss of a loved one, or chronic illness. You may be at higher risk for stress-related problems if you: Do not get enough sleep. Are in poor health. Do not have  emotional support. Have a mental health disorder such as anxiety or depression. How to recognize stress Stress can make you: Have trouble sleeping. Feel sad, anxious, irritable, or overwhelmed. Lose your appetite. Overeat or want to eat unhealthy foods. Want to use drugs or alcohol. Stress can also cause physical symptoms, such as: Sore, tense muscles, especially in the shoulders and neck. Headaches. Trouble breathing. A faster  heart rate. Stomach pain, nausea, or vomiting. Diarrhea or constipation. Trouble concentrating. Follow these instructions at home: Eating and drinking Eat a healthy diet. This includes: Eating foods that are high in fiber, such as beans, whole grains, and fresh fruits and vegetables. Limiting foods that are high in fat and processed sugars, such as fried or sweet foods. Do not skip meals or overeat. Drink enough fluid to keep your urine pale yellow. Alcohol use Do not drink alcohol if: Your health care provider tells you not to drink. You are pregnant, may be pregnant, or are planning to become pregnant. Drinking alcohol is a way some people try to ease their stress. This can be dangerous, so if you drink alcohol: Limit how much you have to: 0-1 drink a day for women. 0-2 drinks a day for men. Know how much alcohol is in your drink. In the [place] one drink equals one 12 oz bottle of beer (355 mL), one 5 oz glass of wine (148 mL), or one 1 oz glass of hard liquor (44 mL). Activity  Include 30 minutes of exercise in your daily schedule. Exercise is a good stress reducer. Include time in your day for an activity that you find relaxing. Try taking a walk, going on a bike ride, reading a book, or listening to music. Schedule your time in a way that lowers stress, and keep a regular schedule. Focus on doing what is most important to get done. Lifestyle Identify the source of your stress and your reaction to it. See a therapist who can help you change unhelpful reactions. When there are stressful events: Talk about them with family, friends, or coworkers. Try to think realistically about stressful events and not ignore them or overreact. Try to find the positives in a stressful situation and not focus on the negatives. Cut back on responsibilities at work and home, if possible. Ask for help from friends or family members if you need it. Find ways to manage stress, such as: Mindfulness,  meditation, or deep breathing. Yoga or tai chi. Progressive muscle relaxation. Spending time in nature. Doing art, playing music, or reading. Making time for fun activities. Spending time with family and friends. Get support from family, friends, or spiritual resources. General instructions Get enough sleep. Try to go to sleep and get up at about the same time every day. Take over-the-counter and prescription medicines only as told by your health care provider. Do not use any products that contain nicotine or tobacco. These products include cigarettes, chewing tobacco, and vaping devices, such as e-cigarettes. If you need help quitting, ask your health care provider. Do not use drugs or smoke to deal with stress. Keep all follow-up visits. This is important. Where to find support Talk with your health care provider about stress management or finding a support group. Find a therapist to work with you on your stress management techniques. Where to find more information [employer] Mental Illness: www.nami.org [employer]: [employer] Contact a health care provider if: Your stress symptoms get worse. You are unable to manage your stress at home. You  are struggling to stop using drugs or alcohol. Get help right away if: You may be a danger to yourself or others. You have any thoughts of death or suicide. Get help right awayif you feel like you may hurt yourself or others, or have thoughts about taking your own life. Go to your nearest emergency room or: Call 911. Call the National Suicide Prevention Lifeline at 404-018-4413 or 988 in the [place] This is open 24 hours a day. Text the Crisis Text Line at 909-888-1395. Summary Feeling a certain amount of stress is normal, but severe or long-term (chronic) stress can affect your mental and physical health. Chronic stress can put you at higher risk for anxiety, depression, and other health problems such as digestive  problems, muscle aches, heart disease, high blood pressure, and stroke. You may be at higher risk for stress-related problems if you do not get enough sleep, are in poor health, lack emotional support, or have a mental health disorder such as anxiety or depression. Identify the source of your stress and your reaction to it. Try talking about stressful events with family, friends, or coworkers, finding a coping method, or getting support from spiritual resources. If you need more help, talk with your health care provider about finding a support group or a mental health therapist. This information is not intended to replace advice given to you by your health care provider. Make sure you discuss any questions you have with your health care provider. Document Revised: 09/05/2020 Document Reviewed: 09/03/2020 Elsevier Patient Education  2023 [employer].

## 2022-08-03 ENCOUNTER — Ambulatory Visit: Payer: No Typology Code available for payment source | Admitting: Family Medicine
